# Patient Record
Sex: Male | Born: 1975 | Race: White | Hispanic: No | Marital: Married | Smoking: Current every day smoker
Health system: Southern US, Community
[De-identification: ages and names within clinical notes are randomized; demographics above are authoritative.]

## PROBLEM LIST (undated history)

## (undated) DIAGNOSIS — Z720 Tobacco use: Secondary | ICD-10-CM

## (undated) DIAGNOSIS — Z789 Other specified health status: Secondary | ICD-10-CM

## (undated) DIAGNOSIS — F1090 Alcohol use, unspecified, uncomplicated: Secondary | ICD-10-CM

## (undated) DIAGNOSIS — I10 Essential (primary) hypertension: Secondary | ICD-10-CM

## (undated) HISTORY — PX: LEG SURGERY: SHX1003

---

## 2009-03-20 ENCOUNTER — Ambulatory Visit (HOSPITAL_COMMUNITY): Admission: RE | Admit: 2009-03-20 | Discharge: 2009-03-21 | Payer: Self-pay | Admitting: Orthopedic Surgery

## 2009-09-21 ENCOUNTER — Inpatient Hospital Stay (HOSPITAL_COMMUNITY): Admission: RE | Admit: 2009-09-21 | Discharge: 2009-09-22 | Payer: Self-pay | Admitting: Orthopedic Surgery

## 2010-09-05 LAB — COMPREHENSIVE METABOLIC PANEL
AST: 22 U/L (ref 0–37)
Albumin: 4.5 g/dL (ref 3.5–5.2)
BUN: 8 mg/dL (ref 6–23)
CO2: 31 mEq/L (ref 19–32)
Calcium: 9.9 mg/dL (ref 8.4–10.5)
Chloride: 103 mEq/L (ref 96–112)
GFR calc non Af Amer: >60 mL/min (ref >60)
Glucose, Bld: 93 mg/dL (ref 70–99)
Potassium: 4.4 mEq/L (ref 3.5–5.1)
Total Bilirubin: 0.9 mg/dL (ref 0.3–1.2)
Total Protein: 7.8 g/dL (ref 6.0–8.3)

## 2010-09-05 LAB — PROTIME-INR: INR: 1.03 (ref 0.00–1.49)

## 2010-09-05 LAB — BASIC METABOLIC PANEL
CO2: 29 mEq/L (ref 19–32)
Chloride: 102 mEq/L (ref 96–112)
GFR calc Af Amer: >60 mL/min (ref >60)
GFR calc non Af Amer: >60 mL/min (ref >60)
Glucose, Bld: 123 mg/dL — ABNORMAL HIGH (ref 70–99)

## 2010-09-05 LAB — CBC
HCT: 47.2 % (ref 39.0–52.0)
MCHC: 34.5 g/dL (ref 30.0–36.0)
MCV: 88.9 fL (ref 78.0–100.0)
RDW: 13 % (ref 11.5–15.5)
RDW: 13.4 % (ref 11.5–15.5)
WBC: 5.7 10*3/uL (ref 4.0–10.5)

## 2010-09-05 LAB — DIFFERENTIAL
Basophils Absolute: 0 10*3/uL (ref 0.0–0.1)
Basophils Relative: 1 % (ref 0–1)
Eosinophils Relative: 2 % (ref 0–5)
Monocytes Absolute: 0.4 10*3/uL (ref 0.1–1.0)
Monocytes Relative: 8 % (ref 3–12)
Neutro Abs: 3.1 10*3/uL (ref 1.7–7.7)
Neutrophils Relative %: 55 % (ref 43–77)

## 2010-09-05 LAB — URINALYSIS, ROUTINE W REFLEX MICROSCOPIC
Glucose, UA: NEGATIVE mg/dL
Ketones, ur: NEGATIVE mg/dL
Nitrite: NEGATIVE
Specific Gravity, Urine: 1.006 (ref 1.005–1.030)
pH: 6.5 (ref 5.0–8.0)

## 2010-09-05 LAB — TYPE AND SCREEN: ABO/RH(D): O POS

## 2010-09-05 LAB — ANAEROBIC CULTURE

## 2010-09-05 LAB — URINE CULTURE

## 2010-09-05 LAB — TISSUE CULTURE: Culture: NO GROWTH

## 2010-09-05 LAB — GRAM STAIN

## 2010-09-20 LAB — CROSSMATCH
ABO/RH(D): O POS
Antibody Screen: NEGATIVE

## 2010-09-20 LAB — ABO/RH: ABO/RH(D): O POS

## 2010-09-20 LAB — CBC
Platelets: 132 10*3/uL — ABNORMAL LOW (ref 150–400)
RBC: 4.46 MIL/uL (ref 4.22–5.81)
WBC: 7.1 10*3/uL (ref 4.0–10.5)

## 2010-09-20 LAB — BASIC METABOLIC PANEL
Calcium: 8.3 mg/dL — ABNORMAL LOW (ref 8.4–10.5)
Creatinine, Ser: 1.06 mg/dL (ref 0.4–1.5)
GFR calc Af Amer: >60 mL/min (ref >60)

## 2010-09-21 LAB — URINALYSIS, ROUTINE W REFLEX MICROSCOPIC
Bilirubin Urine: NEGATIVE
Glucose, UA: NEGATIVE mg/dL
Hgb urine dipstick: NEGATIVE
Ketones, ur: NEGATIVE mg/dL
Protein, ur: NEGATIVE mg/dL
pH: 5 (ref 5.0–8.0)

## 2010-09-21 LAB — CBC
HCT: 47.7 % (ref 39.0–52.0)
MCV: 87.9 fL (ref 78.0–100.0)
RBC: 5.43 MIL/uL (ref 4.22–5.81)
WBC: 6.4 10*3/uL (ref 4.0–10.5)

## 2010-09-21 LAB — COMPREHENSIVE METABOLIC PANEL
AST: 23 U/L (ref 0–37)
Alkaline Phosphatase: 146 U/L — ABNORMAL HIGH (ref 39–117)
BUN: 13 mg/dL (ref 6–23)
CO2: 28 mEq/L (ref 19–32)
Chloride: 106 mEq/L (ref 96–112)
Creatinine, Ser: 1.09 mg/dL (ref 0.4–1.5)
GFR calc Af Amer: >60 mL/min (ref >60)
GFR calc non Af Amer: >60 mL/min (ref >60)
Potassium: 4.1 mEq/L (ref 3.5–5.1)
Total Bilirubin: 1 mg/dL (ref 0.3–1.2)

## 2010-09-21 LAB — URINE CULTURE
Colony Count: NO GROWTH
Culture: NO GROWTH

## 2010-09-21 LAB — DIFFERENTIAL
Basophils Absolute: 0 10*3/uL (ref 0.0–0.1)
Basophils Relative: 1 % (ref 0–1)
Eosinophils Relative: 5 % (ref 0–5)
Lymphocytes Relative: 36 % (ref 12–46)
Monocytes Absolute: 0.5 10*3/uL (ref 0.1–1.0)

## 2010-09-21 LAB — PROTIME-INR: Prothrombin Time: 12.8 seconds (ref 11.6–15.2)

## 2015-09-22 ENCOUNTER — Emergency Department: Payer: BLUE CROSS/BLUE SHIELD

## 2015-09-22 ENCOUNTER — Inpatient Hospital Stay
Admission: EM | Admit: 2015-09-22 | Discharge: 2015-09-23 | DRG: 534 | Disposition: A | Discharge status: Other Acute Inpatient Hospital | Payer: BLUE CROSS/BLUE SHIELD | Attending: Orthopedic Surgery | Admitting: Orthopedic Surgery

## 2015-09-22 DIAGNOSIS — F17210 Nicotine dependence, cigarettes, uncomplicated: Secondary | ICD-10-CM | POA: Diagnosis present

## 2015-09-22 DIAGNOSIS — I10 Essential (primary) hypertension: Secondary | ICD-10-CM | POA: Diagnosis present

## 2015-09-22 DIAGNOSIS — Z79899 Other long term (current) drug therapy: Secondary | ICD-10-CM | POA: Diagnosis not present

## 2015-09-22 DIAGNOSIS — W010XXA Fall on same level from slipping, tripping and stumbling without subsequent striking against object, initial encounter: Secondary | ICD-10-CM | POA: Diagnosis present

## 2015-09-22 DIAGNOSIS — S72401A Unspecified fracture of lower end of right femur, initial encounter for closed fracture: Secondary | ICD-10-CM | POA: Diagnosis present

## 2015-09-22 DIAGNOSIS — S7291XA Unspecified fracture of right femur, initial encounter for closed fracture: Secondary | ICD-10-CM | POA: Diagnosis present

## 2015-09-22 DIAGNOSIS — Z8249 Family history of ischemic heart disease and other diseases of the circulatory system: Secondary | ICD-10-CM

## 2015-09-22 DIAGNOSIS — Q899 Congenital malformation, unspecified: Secondary | ICD-10-CM

## 2015-09-22 DIAGNOSIS — S72451A Displaced supracondylar fracture without intracondylar extension of lower end of right femur, initial encounter for closed fracture: Principal | ICD-10-CM | POA: Diagnosis present

## 2015-09-22 DIAGNOSIS — Y92008 Other place in unspecified non-institutional (private) residence as the place of occurrence of the external cause: Secondary | ICD-10-CM | POA: Diagnosis not present

## 2015-09-22 HISTORY — DX: Essential (primary) hypertension: I10

## 2015-09-22 LAB — COMPREHENSIVE METABOLIC PANEL
ALK PHOS: 81 U/L (ref 38–126)
ALT: 27 U/L (ref 17–63)
AST: 26 U/L (ref 15–41)
Albumin: 4 g/dL (ref 3.5–5.0)
Anion gap: 7 (ref 5–15)
BILIRUBIN TOTAL: 0.7 mg/dL (ref 0.3–1.2)
BUN: 11 mg/dL (ref 6–20)
CALCIUM: 9.2 mg/dL (ref 8.9–10.3)
CO2: 24 mmol/L (ref 22–32)
CREATININE: 0.8 mg/dL (ref 0.61–1.24)
Chloride: 101 mmol/L (ref 101–111)
GFR calc non Af Amer: >60 mL/min (ref >60)
GLUCOSE: 122 mg/dL — AB (ref 65–99)
Potassium: 3.3 mmol/L — ABNORMAL LOW (ref 3.5–5.1)
SODIUM: 132 mmol/L — AB (ref 135–145)
TOTAL PROTEIN: 6.8 g/dL (ref 6.5–8.1)

## 2015-09-22 LAB — PROTIME-INR
INR: 0.96
PROTHROMBIN TIME: 13 s (ref 11.4–15.0)

## 2015-09-22 LAB — CBC
HEMATOCRIT: 41.7 % (ref 40.0–52.0)
HEMOGLOBIN: 14.5 g/dL (ref 13.0–18.0)
MCH: 30.5 pg (ref 26.0–34.0)
MCHC: 34.7 g/dL (ref 32.0–36.0)
MCV: 87.8 fL (ref 80.0–100.0)
Platelets: 168 10*3/uL (ref 150–440)
RBC: 4.75 MIL/uL (ref 4.40–5.90)
RDW: 13 % (ref 11.5–14.5)
WBC: 11.4 10*3/uL — ABNORMAL HIGH (ref 3.8–10.6)

## 2015-09-22 LAB — APTT: APTT: 29 s (ref 24–36)

## 2015-09-22 MED ORDER — HYDROMORPHONE HCL 1 MG/ML IJ SOLN
1.0000 mg | Freq: Once | INTRAMUSCULAR | Status: AC
  Administered 2015-09-22: 1 mg via INTRAVENOUS
  Filled 2015-09-22: qty 1

## 2015-09-22 MED ORDER — SODIUM CHLORIDE 0.9 % IV SOLN
INTRAVENOUS | Status: DC
  Administered 2015-09-23: via INTRAVENOUS

## 2015-09-22 MED ORDER — HYDROMORPHONE HCL 1 MG/ML IJ SOLN
1.0000 mg | INTRAMUSCULAR | Status: DC | PRN
  Administered 2015-09-23: 1 mg via INTRAVENOUS
  Filled 2015-09-22: qty 1

## 2015-09-22 MED ORDER — ONDANSETRON HCL 4 MG/2ML IJ SOLN
4.0000 mg | Freq: Once | INTRAMUSCULAR | Status: AC
  Administered 2015-09-22: 4 mg via INTRAVENOUS
  Filled 2015-09-22: qty 2

## 2015-09-22 MED ORDER — SODIUM CHLORIDE 0.9 % IV BOLUS (SEPSIS)
1000.0000 mL | Freq: Once | INTRAVENOUS | Status: AC
  Administered 2015-09-22: 1000 mL via INTRAVENOUS

## 2015-09-22 MED ORDER — OXYCODONE HCL 5 MG PO TABS
10.0000 mg | ORAL_TABLET | ORAL | Status: DC | PRN
  Administered 2015-09-22: 10 mg via ORAL
  Filled 2015-09-22: qty 2

## 2015-09-22 NOTE — ED Notes (Signed)
Patient transported to CT 

## 2015-09-22 NOTE — ED Notes (Signed)
Pt bib EMS w/ c/o leg injury.  Pt sts that he tried to jump dog gate, fell and has injury to R thigh.  Pt sts that he heard "pop".  Pt does have rod to R lower leg.  EMS gave pt 100 fentynl.  Pt denies pain at time, able to speak w/o issue and denies CP, SOB, N/V/D.
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
## 2015-09-23 ENCOUNTER — Inpatient Hospital Stay (HOSPITAL_COMMUNITY): Payer: BLUE CROSS/BLUE SHIELD

## 2015-09-23 ENCOUNTER — Encounter (HOSPITAL_COMMUNITY): Payer: Self-pay | Admitting: *Deleted

## 2015-09-23 ENCOUNTER — Inpatient Hospital Stay (HOSPITAL_COMMUNITY)
Admission: AD | Admit: 2015-09-23 | Discharge: 2015-09-25 | DRG: 481 | Disposition: A | Discharge status: Home / Self Care | Payer: BLUE CROSS/BLUE SHIELD | Source: Other Acute Inpatient Hospital | Attending: Orthopedic Surgery | Admitting: Orthopedic Surgery

## 2015-09-23 DIAGNOSIS — W010XXA Fall on same level from slipping, tripping and stumbling without subsequent striking against object, initial encounter: Secondary | ICD-10-CM | POA: Diagnosis not present

## 2015-09-23 DIAGNOSIS — M1731 Unilateral post-traumatic osteoarthritis, right knee: Secondary | ICD-10-CM | POA: Diagnosis not present

## 2015-09-23 DIAGNOSIS — F17210 Nicotine dependence, cigarettes, uncomplicated: Secondary | ICD-10-CM | POA: Diagnosis present

## 2015-09-23 DIAGNOSIS — M25561 Pain in right knee: Secondary | ICD-10-CM | POA: Diagnosis present

## 2015-09-23 DIAGNOSIS — E559 Vitamin D deficiency, unspecified: Secondary | ICD-10-CM | POA: Diagnosis not present

## 2015-09-23 DIAGNOSIS — D62 Acute posthemorrhagic anemia: Secondary | ICD-10-CM | POA: Diagnosis not present

## 2015-09-23 DIAGNOSIS — Z79899 Other long term (current) drug therapy: Secondary | ICD-10-CM | POA: Diagnosis not present

## 2015-09-23 DIAGNOSIS — Z01818 Encounter for other preprocedural examination: Secondary | ICD-10-CM

## 2015-09-23 DIAGNOSIS — E8889 Other specified metabolic disorders: Secondary | ICD-10-CM | POA: Diagnosis present

## 2015-09-23 DIAGNOSIS — S72451A Displaced supracondylar fracture without intracondylar extension of lower end of right femur, initial encounter for closed fracture: Principal | ICD-10-CM | POA: Diagnosis present

## 2015-09-23 DIAGNOSIS — S7291XA Unspecified fracture of right femur, initial encounter for closed fracture: Secondary | ICD-10-CM

## 2015-09-23 DIAGNOSIS — Z8249 Family history of ischemic heart disease and other diseases of the circulatory system: Secondary | ICD-10-CM

## 2015-09-23 DIAGNOSIS — I10 Essential (primary) hypertension: Secondary | ICD-10-CM | POA: Diagnosis not present

## 2015-09-23 DIAGNOSIS — S72401A Unspecified fracture of lower end of right femur, initial encounter for closed fracture: Secondary | ICD-10-CM | POA: Diagnosis present

## 2015-09-23 LAB — ABO/RH: ABO/RH(D): O POS

## 2015-09-23 LAB — URINALYSIS COMPLETE WITH MICROSCOPIC (ARMC ONLY)
BACTERIA UA: NONE SEEN
Bilirubin Urine: NEGATIVE
Glucose, UA: NEGATIVE mg/dL
Hgb urine dipstick: NEGATIVE
Ketones, ur: NEGATIVE mg/dL
LEUKOCYTES UA: NEGATIVE
Nitrite: NEGATIVE
PH: 6 (ref 5.0–8.0)
PROTEIN: NEGATIVE mg/dL
Specific Gravity, Urine: 1.003 — ABNORMAL LOW (ref 1.005–1.030)
WBC, UA: NONE SEEN WBC/hpf (ref 0–5)

## 2015-09-23 LAB — TYPE AND SCREEN
ABO/RH(D): O POS
ABO/RH(D): O POS
ANTIBODY SCREEN: NEGATIVE
Antibody Screen: NEGATIVE

## 2015-09-23 LAB — SURGICAL PCR SCREEN
MRSA, PCR: NEGATIVE
Staphylococcus aureus: NEGATIVE

## 2015-09-23 MED ORDER — TEMAZEPAM 15 MG PO CAPS: 15.0000 mg | ORAL_CAPSULE | Freq: Every evening | ORAL | Status: DC | PRN

## 2015-09-23 MED ORDER — BISACODYL 10 MG RE SUPP: 10.0000 mg | Freq: Every day | RECTAL | Status: DC | PRN

## 2015-09-23 MED ORDER — SODIUM CHLORIDE 0.9 % IV SOLN
INTRAVENOUS | Status: DC
Start: 2015-09-23 — End: 2015-09-24
  Administered 2015-09-23: 100 mL/h via INTRAVENOUS
  Administered 2015-09-23: 11:00:00 via INTRAVENOUS

## 2015-09-23 MED ORDER — MAGNESIUM HYDROXIDE 400 MG/5ML PO SUSP: 30.0000 mL | Freq: Every day | ORAL | Status: DC | PRN

## 2015-09-23 MED ORDER — ACETAMINOPHEN 650 MG RE SUPP: 650.0000 mg | Freq: Four times a day (QID) | RECTAL | Status: DC | PRN

## 2015-09-23 MED ORDER — OXYCODONE HCL 5 MG PO TABS
5.0000 mg | ORAL_TABLET | ORAL | Status: DC | PRN
  Administered 2015-09-23: 10 mg via ORAL
  Filled 2015-09-23: qty 2

## 2015-09-23 MED ORDER — METHOCARBAMOL 500 MG PO TABS: 500.0000 mg | ORAL_TABLET | Freq: Four times a day (QID) | ORAL | Status: DC | PRN

## 2015-09-23 MED ORDER — HYDROMORPHONE HCL 1 MG/ML IJ SOLN
1.0000 mg | INTRAMUSCULAR | Status: DC | PRN
  Administered 2015-09-23 – 2015-09-25 (×9): 1 mg via INTRAVENOUS
  Filled 2015-09-23 (×9): qty 1

## 2015-09-23 MED ORDER — DOCUSATE SODIUM 100 MG PO CAPS
100.0000 mg | ORAL_CAPSULE | Freq: Two times a day (BID) | ORAL | Status: DC
  Administered 2015-09-23 (×2): 100 mg via ORAL
  Filled 2015-09-23 (×3): qty 1

## 2015-09-23 MED ORDER — METHOCARBAMOL 500 MG PO TABS
500.0000 mg | ORAL_TABLET | Freq: Four times a day (QID) | ORAL | Status: DC | PRN
  Administered 2015-09-23 – 2015-09-24 (×3): 1000 mg via ORAL
  Filled 2015-09-23 (×2): qty 2

## 2015-09-23 MED ORDER — PANTOPRAZOLE SODIUM 40 MG PO TBEC
40.0000 mg | DELAYED_RELEASE_TABLET | Freq: Every day | ORAL | Status: DC
  Administered 2015-09-23 – 2015-09-25 (×2): 40 mg via ORAL
  Filled 2015-09-23 (×2): qty 1

## 2015-09-23 MED ORDER — CHLORHEXIDINE GLUCONATE 4 % EX LIQD
60.0000 mL | Freq: Once | CUTANEOUS | Status: AC
  Administered 2015-09-24: 4 via TOPICAL
  Filled 2015-09-23: qty 60

## 2015-09-23 MED ORDER — CEFAZOLIN SODIUM-DEXTROSE 2-4 GM/100ML-% IV SOLN
2.0000 g | INTRAVENOUS | Status: AC
  Administered 2015-09-24: 2 g via INTRAVENOUS
  Filled 2015-09-23: qty 100

## 2015-09-23 MED ORDER — FLEET ENEMA 7-19 GM/118ML RE ENEM: 1.0000 | ENEMA | Freq: Once | RECTAL | Status: DC | PRN

## 2015-09-23 MED ORDER — POVIDONE-IODINE 10 % EX SWAB: 2.0000 "application " | Freq: Once | CUTANEOUS | Status: DC

## 2015-09-23 MED ORDER — OXYCODONE HCL 5 MG PO TABS
5.0000 mg | ORAL_TABLET | ORAL | Status: DC | PRN
  Administered 2015-09-23 – 2015-09-25 (×11): 15 mg via ORAL
  Filled 2015-09-23 (×11): qty 3

## 2015-09-23 MED ORDER — DOCUSATE SODIUM 100 MG PO CAPS: 100.0000 mg | ORAL_CAPSULE | Freq: Two times a day (BID) | ORAL | Status: DC

## 2015-09-23 MED ORDER — DIPHENHYDRAMINE HCL 12.5 MG/5ML PO ELIX: 12.5000 mg | ORAL_SOLUTION | ORAL | Status: DC | PRN

## 2015-09-23 MED ORDER — ONDANSETRON HCL 4 MG/2ML IJ SOLN: 4.0000 mg | Freq: Four times a day (QID) | INTRAMUSCULAR | Status: DC | PRN

## 2015-09-23 MED ORDER — ACETAMINOPHEN 500 MG PO TABS
1000.0000 mg | ORAL_TABLET | Freq: Four times a day (QID) | ORAL | Status: DC
  Administered 2015-09-23 – 2015-09-25 (×7): 1000 mg via ORAL
  Filled 2015-09-23 (×8): qty 2

## 2015-09-23 MED ORDER — DEXTROSE 5 % IV SOLN: 500.0000 mg | Freq: Four times a day (QID) | INTRAVENOUS | Status: DC | PRN

## 2015-09-23 MED ORDER — ONDANSETRON HCL 4 MG PO TABS: 4.0000 mg | ORAL_TABLET | Freq: Four times a day (QID) | ORAL | Status: DC | PRN

## 2015-09-23 MED ORDER — ACETAMINOPHEN 325 MG PO TABS: 650.0000 mg | ORAL_TABLET | Freq: Four times a day (QID) | ORAL | Status: DC | PRN

## 2015-09-23 MED ORDER — POTASSIUM CHLORIDE 20 MEQ PO PACK
40.0000 meq | PACK | Freq: Once | ORAL | Status: AC
  Administered 2015-09-23: 40 meq via ORAL
  Filled 2015-09-23: qty 2

## 2015-09-23 MED ORDER — METHOCARBAMOL 1000 MG/10ML IJ SOLN: 500.0000 mg | Freq: Four times a day (QID) | INTRAVENOUS | Status: DC | PRN

## 2015-09-23 NOTE — Progress Notes (Signed)
Orthopaedic Trauma Service Progress Note  Subjective  Pt seen and evaluated See dictation for full report:[id number]   Review of Systems  Constitutional: Negative for fever and chills.  Eyes: Negative for blurred vision and double vision.  Respiratory: Negative for shortness of breath and wheezing.   Cardiovascular: Negative for chest pain and palpitations.  Gastrointestinal: Negative for nausea, vomiting and abdominal pain.  Genitourinary: Negative for dysuria.  Musculoskeletal:       R knee pain   Neurological: Negative for headaches.       No new numbness or tingling Baseline right foot plantar numbness     Objective   BP 115/71 mmHg  Pulse 76  Temp(Src) 98.5 F (36.9 C) (Oral)  Resp 16  Ht _0  (1.956 m)  Wt 99.79 kg (220 lb)  BMI 26.08 kg/m2  SpO2 97%  Intake/Output      04/07 0701 - 04/08 0700 04/08 0701 - 04/09 0700   P.O.  0   Total Intake(mL/kg)  0 (0)   Urine (mL/kg/hr) 700 300 (0.5)   Total Output 700 300   Net -700 -300          Labs Results for ISRAEL, WERTS (MRN [id number]) as of 09/23/2015 12:27  Ref. Range 09/22/2015 22:39  Sodium Latest Ref Range: 135-145 mmol/L 132 (L)  Potassium Latest Ref Range: 3.5-5.1 mmol/L 3.3 (L)  Chloride Latest Ref Range: 101-111 mmol/L 101  CO2 Latest Ref Range: 22-32 mmol/L 24  BUN Latest Ref Range: 6-20 mg/dL 11  Creatinine Latest Ref Range: 0.61-1.24 mg/dL 0.80  Calcium Latest Ref Range: 8.9-10.3 mg/dL 9.2  EGFR (Non-African Amer.) Latest Ref Range: >60 mL/min >60  EGFR (African American) Latest Ref Range: >60 mL/min >60  Glucose Latest Ref Range: 65-99 mg/dL 122 (H)  Anion gap Latest Ref Range: 5-15  7  Alkaline Phosphatase Latest Ref Range: 38-126 U/L 81  Albumin Latest Ref Range: 3.5-5.0 g/dL 4.0  AST Latest Ref Range: 15-41 U/L 26  ALT Latest Ref Range: 17-63 U/L 27  Total Protein Latest Ref Range: 6.5-8.1 g/dL 6.8  Total Bilirubin Latest Ref Range: 0.3-1.2 mg/dL 0.7  WBC Latest Ref Range: 3.8-10.6 K/uL  11.4 (H)  RBC Latest Ref Range: 4.40-5.90 MIL/uL 4.75  Hemoglobin Latest Ref Range: 13.0-18.0 g/dL 14.5  HCT Latest Ref Range: 40.0-52.0 % 41.7  MCV Latest Ref Range: 80.0-100.0 fL 87.8  MCH Latest Ref Range: 26.0-34.0 pg 30.5  MCHC Latest Ref Range: 32.0-36.0 g/dL 34.7  RDW Latest Ref Range: 11.5-14.5 % 13.0  Platelets Latest Ref Range: 150-440 K/uL 168  Prothrombin Time Latest Ref Range: 11.4-15.0 seconds 13.0  INR Unknown 0.96  APTT Latest Ref Range: 24-36 seconds 29     Exam  Gen: awake and alert, NAD, in bed Lungs: clear fields B Cardiac: RRR, s1 and s2 Abd: + BS, NTND Ext:       Right Lower Extremity   Knee immobilizer in place  No compressive dressing  Old surgical wounds well healed  + deformity distal femur  + knee effusion  Hip and ankle unremarkable  + DP pulse  Swelling minimal  Ext warm  No EHL function, no toe flexion (this is baseline)  Ankle function preserved   Knee stability not assessed due to acute fracture   No open wounds   Imaging  CT R distal femur- comminuted R distal femur fx with extensive metaphyseal involvement. Fracture planes extend into the joint. Severe DJD R knee noted as well  Assessment and Plan  POD/HD#: 1   - comminuted R distal femur fracture with pre-existing endstage DJD R knee:  OR tomorrow for IMN R distal femur  Bedrest for now  Ice   Knee immobilizer   - Pain management:  Continue with current regimen   - ABL anemia/Hemodynamics  Stable   - Medical issues   Nicotine dependence   Discussed nicotine use. 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   - DVT/PE prophylaxis:  Foot pumps for now  Lovenox post op  - ID:   periop abx  - Metabolic Bone Disease:  Low energy fracture mechanism, will check vitamin D levels and evaluate from there  - Activity:  Bed rest  - FEN/GI prophylaxis/Foley/Lines:  Reg diet  protonix  IVF  NPO after MN   - Dispo:  OR tomorrow for retrograde  IMN R femur     Jari Pigg, PA-C [facility] [phone] (O) 09/23/2015 12:30 PM

## 2015-09-23 NOTE — Progress Notes (Signed)
I reviewed the patient's right knee CT scan with 3D reconstructions.  The fracture is extremely comminuted and extends very distal to the femoral condyles posteriorly.  I feel this fracture is complex enough to require management by an orthopaedic trauma specialist.  I have personally spoken with Dr. Myrene GalasMichael Handy at [facility] who has agreed to take care of this patient at [facility].  The patient will be transferred to [facility] tonight for surgical fixation tomorrow.  I have ordered a knee immobilizer to be applied to the right knee for transfer.   I have also ordered potassium chloride to be given for his potassium level of 3.3.  I have told the ER attending to send a copy of the CT scan with the patient for review by Dr. Carola FrostHandy.  I have explained the plan to the patient.  He knows Dr. Carola FrostHandy from his prior tibial operation.  He and his wife are in agreement with this plan.

## 2015-09-23 NOTE — ED Provider Notes (Signed)
-----------------------------------------   12:56 AM on 09/23/2015 -----------------------------------------   Blood pressure 126/75, pulse 89, temperature 98.2 F (36.8 C), temperature source Oral, resp. rate 16, height 6\' 5"  (1.956 m), weight 220 lb (99.791 kg), SpO2 99 %.  Assuming care from Dr. Lenard LancePaduchowski.  In short, Christian Bates is a 40 y.o. male with a chief complaint of Leg Injury .  Refer to the original H&P for additional details.  The current plan of care is to await instruction from Dr. Shela NevinKrasinksi.  The patient has previously had surgery done by Dr. Carola FrostHandy at [facility] and Dr. Martha ClanKrasinski reports that he was attempting to transfer the patient so he can have continued care by his previous orthopedic surgeon. Dr. Martha ClanKrasinski did speak to Dr. Carola FrostHandy who did accept the patient in transfer to [facility]. The patient will be transferred for orthopedic surgery.   Rebecka ApleyAllison P Vardaan Depascale, MD 09/23/15 [phone]

## 2015-09-24 ENCOUNTER — Inpatient Hospital Stay (HOSPITAL_COMMUNITY): Payer: BLUE CROSS/BLUE SHIELD | Admitting: Anesthesiology

## 2015-09-24 ENCOUNTER — Inpatient Hospital Stay (HOSPITAL_COMMUNITY): Payer: BLUE CROSS/BLUE SHIELD

## 2015-09-24 ENCOUNTER — Encounter (HOSPITAL_COMMUNITY)
Admission: AD | Disposition: A | Discharge status: Home / Self Care | Payer: Self-pay | Source: Other Acute Inpatient Hospital | Attending: Orthopedic Surgery

## 2015-09-24 HISTORY — PX: FEMUR IM NAIL: SHX1597

## 2015-09-24 LAB — APTT: aPTT: 31 seconds (ref 24–37)

## 2015-09-24 LAB — CBC
HEMATOCRIT: 35.5 % — AB (ref 39.0–52.0)
HEMATOCRIT: 33.3 % — AB (ref 39.0–52.0)
HEMOGLOBIN: 12.1 g/dL — AB (ref 13.0–17.0)
HEMOGLOBIN: 11 g/dL — AB (ref 13.0–17.0)
MCH: 31.3 pg (ref 26.0–34.0)
MCH: 30 pg (ref 26.0–34.0)
MCHC: 34.1 g/dL (ref 30.0–36.0)
MCHC: 33 g/dL (ref 30.0–36.0)
MCV: 92 fL (ref 78.0–100.0)
MCV: 90.7 fL (ref 78.0–100.0)
Platelets: 136 10*3/uL — ABNORMAL LOW (ref 150–400)
Platelets: 133 10*3/uL — ABNORMAL LOW (ref 150–400)
RBC: 3.86 MIL/uL — ABNORMAL LOW (ref 4.22–5.81)
RBC: 3.67 MIL/uL — ABNORMAL LOW (ref 4.22–5.81)
RDW: 12.9 % (ref 11.5–15.5)
RDW: 12.5 % (ref 11.5–15.5)
WBC: 5.5 10*3/uL (ref 4.0–10.5)
WBC: 6.7 10*3/uL (ref 4.0–10.5)

## 2015-09-24 LAB — COMPREHENSIVE METABOLIC PANEL
ALK PHOS: 84 U/L (ref 38–126)
ALT: 18 U/L (ref 17–63)
ANION GAP: 8 (ref 5–15)
AST: 19 U/L (ref 15–41)
Albumin: 3.1 g/dL — ABNORMAL LOW (ref 3.5–5.0)
BILIRUBIN TOTAL: 1.1 mg/dL (ref 0.3–1.2)
BUN: 6 mg/dL (ref 6–20)
CALCIUM: 8.6 mg/dL — AB (ref 8.9–10.3)
CO2: 25 mmol/L (ref 22–32)
CREATININE: 0.88 mg/dL (ref 0.61–1.24)
Chloride: 104 mmol/L (ref 101–111)
GFR calc Af Amer: >60 mL/min (ref >60)
GFR calc non Af Amer: >60 mL/min (ref >60)
GLUCOSE: 103 mg/dL — AB (ref 65–99)
Potassium: 4 mmol/L (ref 3.5–5.1)
Sodium: 137 mmol/L (ref 135–145)
TOTAL PROTEIN: 5.6 g/dL — AB (ref 6.5–8.1)

## 2015-09-24 LAB — PROTIME-INR
INR: 1.05 (ref 0.00–1.49)
Prothrombin Time: 13.9 seconds (ref 11.6–15.2)

## 2015-09-24 LAB — CREATININE, SERUM
CREATININE: 0.95 mg/dL (ref 0.61–1.24)
GFR calc Af Amer: >60 mL/min (ref >60)
GFR calc non Af Amer: >60 mL/min (ref >60)

## 2015-09-24 SURGERY — INSERTION, INTRAMEDULLARY ROD, FEMUR, RETROGRADE
Anesthesia: General | Site: Leg Lower | Laterality: Right

## 2015-09-24 MED ORDER — OXYCODONE HCL 5 MG PO TABS: 5.0000 mg | ORAL_TABLET | Freq: Once | ORAL | Status: DC | PRN

## 2015-09-24 MED ORDER — ONDANSETRON HCL 4 MG/2ML IJ SOLN
INTRAMUSCULAR | Status: AC
  Filled 2015-09-24: qty 2

## 2015-09-24 MED ORDER — ROCURONIUM BROMIDE 50 MG/5ML IV SOLN
INTRAVENOUS | Status: AC
  Filled 2015-09-24: qty 1

## 2015-09-24 MED ORDER — POLYETHYLENE GLYCOL 3350 17 G PO PACK
17.0000 g | PACK | Freq: Every day | ORAL | Status: DC
  Administered 2015-09-25: 17 g via ORAL
  Filled 2015-09-24: qty 1

## 2015-09-24 MED ORDER — DEXAMETHASONE SODIUM PHOSPHATE 4 MG/ML IJ SOLN
INTRAMUSCULAR | Status: AC
Start: 2015-09-24 — End: 2015-09-24
  Filled 2015-09-24: qty 1

## 2015-09-24 MED ORDER — FENTANYL CITRATE (PF) 250 MCG/5ML IJ SOLN
INTRAMUSCULAR | Status: AC
  Filled 2015-09-24: qty 5

## 2015-09-24 MED ORDER — ACETAMINOPHEN 325 MG PO TABS
650.0000 mg | ORAL_TABLET | Freq: Four times a day (QID) | ORAL | Status: DC | PRN
Start: 2015-09-24 — End: 2015-09-25

## 2015-09-24 MED ORDER — ONDANSETRON HCL 4 MG/2ML IJ SOLN
INTRAMUSCULAR | Status: DC | PRN
  Administered 2015-09-24: 4 mg via INTRAVENOUS

## 2015-09-24 MED ORDER — PROPOFOL 10 MG/ML IV BOLUS
INTRAVENOUS | Status: AC
  Filled 2015-09-24: qty 20

## 2015-09-24 MED ORDER — 0.9 % SODIUM CHLORIDE (POUR BTL) OPTIME
TOPICAL | Status: DC | PRN
  Administered 2015-09-24: 1000 mL

## 2015-09-24 MED ORDER — DOCUSATE SODIUM 100 MG PO CAPS
100.0000 mg | ORAL_CAPSULE | Freq: Two times a day (BID) | ORAL | Status: DC
  Administered 2015-09-24 – 2015-09-25 (×2): 100 mg via ORAL
  Filled 2015-09-24 (×2): qty 1

## 2015-09-24 MED ORDER — LIDOCAINE HCL (CARDIAC) 20 MG/ML IV SOLN
INTRAVENOUS | Status: DC | PRN
  Administered 2015-09-24: 70 mg via INTRATRACHEAL

## 2015-09-24 MED ORDER — POTASSIUM CHLORIDE IN NACL 20-0.9 MEQ/L-% IV SOLN
INTRAVENOUS | Status: DC
  Administered 2015-09-24: 15:00:00 via INTRAVENOUS
  Filled 2015-09-24: qty 1000

## 2015-09-24 MED ORDER — MIDAZOLAM HCL 2 MG/2ML IJ SOLN
INTRAMUSCULAR | Status: AC
  Filled 2015-09-24: qty 2

## 2015-09-24 MED ORDER — SUGAMMADEX SODIUM 200 MG/2ML IV SOLN
INTRAVENOUS | Status: DC | PRN
  Administered 2015-09-24: 200 mg via INTRAVENOUS

## 2015-09-24 MED ORDER — ROCURONIUM BROMIDE 100 MG/10ML IV SOLN
INTRAVENOUS | Status: DC | PRN
  Administered 2015-09-24: 20 mg via INTRAVENOUS
  Administered 2015-09-24: 10 mg via INTRAVENOUS
  Administered 2015-09-24 (×2): 50 mg via INTRAVENOUS
  Administered 2015-09-24: 10 mg via INTRAVENOUS

## 2015-09-24 MED ORDER — LISINOPRIL-HYDROCHLOROTHIAZIDE 10-12.5 MG PO TABS: 1.0000 | ORAL_TABLET | Freq: Every day | ORAL | Status: DC

## 2015-09-24 MED ORDER — ONDANSETRON HCL 4 MG PO TABS: 4.0000 mg | ORAL_TABLET | Freq: Four times a day (QID) | ORAL | Status: DC | PRN

## 2015-09-24 MED ORDER — SUGAMMADEX SODIUM 200 MG/2ML IV SOLN
INTRAVENOUS | Status: AC
  Filled 2015-09-24: qty 2

## 2015-09-24 MED ORDER — HYDROCHLOROTHIAZIDE 12.5 MG PO CAPS
12.5000 mg | ORAL_CAPSULE | Freq: Every day | ORAL | Status: DC
  Administered 2015-09-24 – 2015-09-25 (×2): 12.5 mg via ORAL
  Filled 2015-09-24 (×2): qty 1

## 2015-09-24 MED ORDER — ENOXAPARIN SODIUM 40 MG/0.4ML ~~LOC~~ SOLN
40.0000 mg | SUBCUTANEOUS | Status: DC
  Administered 2015-09-25: 40 mg via SUBCUTANEOUS
  Filled 2015-09-24: qty 0.4

## 2015-09-24 MED ORDER — HYDROMORPHONE HCL 1 MG/ML IJ SOLN
0.2500 mg | INTRAMUSCULAR | Status: DC | PRN
  Administered 2015-09-24 (×4): 0.5 mg via INTRAVENOUS

## 2015-09-24 MED ORDER — LIDOCAINE HCL (CARDIAC) 20 MG/ML IV SOLN
INTRAVENOUS | Status: AC
  Filled 2015-09-24: qty 5

## 2015-09-24 MED ORDER — DEXAMETHASONE SODIUM PHOSPHATE 4 MG/ML IJ SOLN
INTRAMUSCULAR | Status: DC | PRN
  Administered 2015-09-24: 4 mg via INTRAVENOUS

## 2015-09-24 MED ORDER — LACTATED RINGERS IV SOLN
INTRAVENOUS | Status: DC
  Administered 2015-09-24 (×2): via INTRAVENOUS

## 2015-09-24 MED ORDER — FENTANYL CITRATE (PF) 250 MCG/5ML IJ SOLN
INTRAMUSCULAR | Status: DC | PRN
  Administered 2015-09-24 (×2): 50 ug via INTRAVENOUS
  Administered 2015-09-24: 150 ug via INTRAVENOUS
  Administered 2015-09-24: 100 ug via INTRAVENOUS
  Administered 2015-09-24 (×3): 50 ug via INTRAVENOUS

## 2015-09-24 MED ORDER — METOCLOPRAMIDE HCL 5 MG/ML IJ SOLN: 5.0000 mg | Freq: Three times a day (TID) | INTRAMUSCULAR | Status: DC | PRN

## 2015-09-24 MED ORDER — METOCLOPRAMIDE HCL 5 MG PO TABS: 5.0000 mg | ORAL_TABLET | Freq: Three times a day (TID) | ORAL | Status: DC | PRN

## 2015-09-24 MED ORDER — HYDROMORPHONE HCL 1 MG/ML IJ SOLN
INTRAMUSCULAR | Status: AC
  Administered 2015-09-24: 0.5 mg via INTRAVENOUS
  Filled 2015-09-24: qty 2

## 2015-09-24 MED ORDER — MIDAZOLAM HCL 2 MG/2ML IJ SOLN
INTRAMUSCULAR | Status: DC | PRN
  Administered 2015-09-24: 2 mg via INTRAVENOUS

## 2015-09-24 MED ORDER — ACETAMINOPHEN 650 MG RE SUPP: 650.0000 mg | Freq: Four times a day (QID) | RECTAL | Status: DC | PRN

## 2015-09-24 MED ORDER — LISINOPRIL 10 MG PO TABS
10.0000 mg | ORAL_TABLET | Freq: Every day | ORAL | Status: DC
  Administered 2015-09-24 – 2015-09-25 (×2): 10 mg via ORAL
  Filled 2015-09-24 (×2): qty 1

## 2015-09-24 MED ORDER — ONDANSETRON HCL 4 MG/2ML IJ SOLN: 4.0000 mg | Freq: Four times a day (QID) | INTRAMUSCULAR | Status: DC | PRN

## 2015-09-24 MED ORDER — OXYCODONE HCL 5 MG/5ML PO SOLN: 5.0000 mg | Freq: Once | ORAL | Status: DC | PRN

## 2015-09-24 MED ORDER — PROPOFOL 10 MG/ML IV BOLUS
INTRAVENOUS | Status: DC | PRN
  Administered 2015-09-24: 20 mg via INTRAVENOUS
  Administered 2015-09-24: 180 mg via INTRAVENOUS

## 2015-09-24 MED ORDER — CEFAZOLIN SODIUM 1-5 GM-% IV SOLN
1.0000 g | Freq: Four times a day (QID) | INTRAVENOUS | Status: AC
  Administered 2015-09-24 – 2015-09-25 (×3): 1 g via INTRAVENOUS
  Filled 2015-09-24 (×3): qty 50

## 2015-09-24 SURGICAL SUPPLY — 70 items
BANDAGE ACE 6X5 VEL STRL LF (GAUZE/BANDAGES/DRESSINGS) ×6 IMPLANT
BANDAGE ELASTIC 4 VELCRO ST LF (GAUZE/BANDAGES/DRESSINGS) IMPLANT
BANDAGE ELASTIC 6 VELCRO ST LF (GAUZE/BANDAGES/DRESSINGS) IMPLANT
BANDAGE ESMARK 6X9 LF (GAUZE/BANDAGES/DRESSINGS) IMPLANT
BIT DRILL CALIBRATED 4.3MMX365 (DRILL) ×1 IMPLANT
BIT DRILL CROWE PNT TWST 4.5MM (DRILL) ×1 IMPLANT
BNDG COHESIVE 4X5 TAN STRL (GAUZE/BANDAGES/DRESSINGS) ×3 IMPLANT
BNDG ESMARK 6X9 LF (GAUZE/BANDAGES/DRESSINGS)
BNDG GAUZE ELAST 4 BULKY (GAUZE/BANDAGES/DRESSINGS) ×6 IMPLANT
BRUSH SCRUB DISP (MISCELLANEOUS) ×6 IMPLANT
COVER MAYO STAND STRL (DRAPES) IMPLANT
COVER SURGICAL LIGHT HANDLE (MISCELLANEOUS) ×3 IMPLANT
DRAPE C-ARM 42X72 X-RAY (DRAPES) ×3 IMPLANT
DRAPE C-ARMOR (DRAPES) ×3 IMPLANT
DRAPE IMP U-DRAPE 54X76 (DRAPES) ×3 IMPLANT
DRAPE INCISE IOBAN 66X45 STRL (DRAPES) IMPLANT
DRAPE ORTHO SPLIT 77X108 STRL (DRAPES) ×4
DRAPE SURG ORHT 6 SPLT 77X108 (DRAPES) ×2 IMPLANT
DRAPE U-SHAPE 47X51 STRL (DRAPES) ×3 IMPLANT
DRILL CALIBRATED 4.3MMX365 (DRILL) ×3
DRILL CROWE POINT TWIST 4.5MM (DRILL) ×3
DRSG MEPILEX BORDER 4X4 (GAUZE/BANDAGES/DRESSINGS) ×3 IMPLANT
DRSG MEPITEL 4X7.2 (GAUZE/BANDAGES/DRESSINGS) ×3 IMPLANT
DRSG PAD ABDOMINAL 8X10 ST (GAUZE/BANDAGES/DRESSINGS) ×3 IMPLANT
ELECT REM PT RETURN 9FT ADLT (ELECTROSURGICAL) ×3
ELECTRODE REM PT RTRN 9FT ADLT (ELECTROSURGICAL) ×1 IMPLANT
EVACUATOR 1/8 PVC DRAIN (DRAIN) IMPLANT
GAUZE SPONGE 4X4 12PLY STRL (GAUZE/BANDAGES/DRESSINGS) IMPLANT
GAUZE XEROFORM 1X8 LF (GAUZE/BANDAGES/DRESSINGS) IMPLANT
GLOVE BIO SURGEON STRL SZ7.5 (GLOVE) IMPLANT
GLOVE BIO SURGEON STRL SZ8 (GLOVE) ×3 IMPLANT
GLOVE BIOGEL PI IND STRL 7.5 (GLOVE) ×1 IMPLANT
GLOVE BIOGEL PI IND STRL 8 (GLOVE) ×1 IMPLANT
GLOVE BIOGEL PI INDICATOR 7.5 (GLOVE) ×2
GLOVE BIOGEL PI INDICATOR 8 (GLOVE) ×2
GOWN STRL REUS W/ TWL LRG LVL3 (GOWN DISPOSABLE) ×1 IMPLANT
GOWN STRL REUS W/ TWL XL LVL3 (GOWN DISPOSABLE) ×1 IMPLANT
GOWN STRL REUS W/TWL LRG LVL3 (GOWN DISPOSABLE) ×2
GOWN STRL REUS W/TWL XL LVL3 (GOWN DISPOSABLE) ×2
GUIDEPIN 3.2X17.5 THRD DISP (PIN) ×3 IMPLANT
GUIDEWIRE BEAD TIP (WIRE) ×6 IMPLANT
KIT BASIN OR (CUSTOM PROCEDURE TRAY) ×3 IMPLANT
KIT ROOM TURNOVER OR (KITS) ×3 IMPLANT
NAIL FEM RETRO 12X460 (Nail) ×3 IMPLANT
PACK ORTHO EXTREMITY (CUSTOM PROCEDURE TRAY) ×3 IMPLANT
PACK UNIVERSAL I (CUSTOM PROCEDURE TRAY) ×3 IMPLANT
PAD ARMBOARD 7.5X6 YLW CONV (MISCELLANEOUS) ×6 IMPLANT
PADDING CAST ABS 4INX4YD NS (CAST SUPPLIES)
PADDING CAST ABS COTTON 4X4 ST (CAST SUPPLIES) IMPLANT
SCREW CORT TI DBL LEAD 5X36 (Screw) ×3 IMPLANT
SCREW CORT TI DBL LEAD 5X38 (Screw) ×3 IMPLANT
SCREW CORT TI DBL LEAD 5X85 (Screw) ×9 IMPLANT
SCREW CORT TI DBL LEAD 5X95 (Screw) ×3 IMPLANT
SPONGE LAP 18X18 X RAY DECT (DISPOSABLE) IMPLANT
STAPLER VISISTAT 35W (STAPLE) ×3 IMPLANT
STOCKINETTE IMPERVIOUS LG (DRAPES) ×3 IMPLANT
SUT ETHILON 3 0 PS 1 (SUTURE) ×6 IMPLANT
SUT PROLENE 3 0 PS 2 (SUTURE) IMPLANT
SUT VIC AB 0 CT1 27 (SUTURE)
SUT VIC AB 0 CT1 27XBRD ANBCTR (SUTURE) IMPLANT
SUT VIC AB 1 CT1 27 (SUTURE) ×2
SUT VIC AB 1 CT1 27XBRD ANBCTR (SUTURE) ×1 IMPLANT
SUT VIC AB 2-0 CT1 27 (SUTURE) ×2
SUT VIC AB 2-0 CT1 TAPERPNT 27 (SUTURE) ×1 IMPLANT
SUT VIC AB 2-0 CT3 27 (SUTURE) IMPLANT
TOWEL OR 17X24 6PK STRL BLUE (TOWEL DISPOSABLE) ×3 IMPLANT
TOWEL OR 17X26 10 PK STRL BLUE (TOWEL DISPOSABLE) ×6 IMPLANT
TUBE CONNECTING 12'X1/4 (SUCTIONS) ×1
TUBE CONNECTING 12X1/4 (SUCTIONS) ×2 IMPLANT
YANKAUER SUCT BULB TIP NO VENT (SUCTIONS) ×3 IMPLANT

## 2015-09-24 NOTE — Anesthesia Postprocedure Evaluation (Signed)
Anesthesia Post Note  Patient: Christian Bates  Procedure(s) Performed: Procedure(s) (LRB): INTRAMEDULLARY (IM) RETROGRADE FEMORAL NAILING (Right)  Patient location during evaluation: PACU Anesthesia Type: General Level of consciousness: awake Pain management: pain level controlled Vital Signs Assessment: post-procedure vital signs reviewed and stable Respiratory status: spontaneous breathing and respiratory function stable Cardiovascular status: stable Postop Assessment: no signs of nausea or vomiting Anesthetic complications: no    Last Vitals:  Filed Vitals:   09/24/15 1400 09/24/15 1420  BP: 145/90 131/99  Pulse: 102 104  Temp:  37.4 C  Resp: 10 14    Last Pain:  Filed Vitals:   09/24/15 1421  PainSc: 4                  [name]

## 2015-09-24 NOTE — Progress Notes (Signed)
Utilization review completed.  

## 2015-09-24 NOTE — Anesthesia Procedure Notes (Signed)
Procedure Name: Intubation Date/Time: 09/24/2015 10:26 AM Performed by: Alanda AmassFRIEDMAN, Chrystian Ressler A Pre-anesthesia Checklist: Emergency Drugs available, Suction available, Timeout performed, Patient being monitored and Patient identified Patient Re-evaluated:Patient Re-evaluated prior to inductionOxygen Delivery Method: Circle system utilized Preoxygenation: Pre-oxygenation with 100% oxygen Intubation Type: IV induction Ventilation: Mask ventilation without difficulty Laryngoscope Size: Mac and 3 Grade View: Grade III Tube type: Oral Tube size: 7.5 mm Number of attempts: 1 Airway Equipment and Method: Stylet Placement Confirmation: ETT inserted through vocal cords under direct vision,  positive ETCO2 and breath sounds checked- equal and bilateral Secured at: 23 cm Tube secured with: Tape Dental Injury: Teeth and Oropharynx as per pre-operative assessment

## 2015-09-24 NOTE — Anesthesia Preprocedure Evaluation (Addendum)
Anesthesia Evaluation  Patient identified by MRN, date of birth, ID band Patient awake    Reviewed: Allergy & Precautions, NPO status , Patient's Chart, lab work & pertinent test results  History of Anesthesia Complications Negative for: history of anesthetic complications  Airway Mallampati: II  TM Distance: >3 FB Neck ROM: Full    Dental  (+) Teeth Intact, Dental Advisory Given   Pulmonary neg shortness of breath, neg sleep apnea, neg COPD, neg recent URI, Current Smoker,    breath sounds clear to auscultation       Cardiovascular hypertension, Pt. on medications (-) angina(-) Past MI  Rhythm:Regular     Neuro/Psych negative neurological ROS  negative psych ROS   GI/Hepatic negative GI ROS, Neg liver ROS,   Endo/Other  negative endocrine ROS  Renal/GU negative Renal ROS     Musculoskeletal   Abdominal   Peds  Hematology negative hematology ROS (+)   Anesthesia Other Findings Right femur fracture  Reproductive/Obstetrics                            Anesthesia Physical Anesthesia Plan  ASA: II  Anesthesia Plan: General   Post-op Pain Management:    Induction: Intravenous  Airway Management Planned: LMA and Oral ETT  Additional Equipment: None  Intra-op Plan:   Post-operative Plan: Extubation in OR  Informed Consent: I have reviewed the patients History and Physical, chart, labs and discussed the procedure including the risks, benefits and alternatives for the proposed anesthesia with the patient or authorized representative who has indicated his/her understanding and acceptance.   Dental advisory given  Plan Discussed with: CRNA and Surgeon  Anesthesia Plan Comments:         Anesthesia Quick Evaluation

## 2015-09-24 NOTE — Consult Note (Signed)
NAMNancie Neas:  Kubicek, Quentyn              ACCOUNT NO.:  [id number]  MEDICAL RECORD NO.:  [id number]  LOCATION:  5N12C                        FACILITY:  [facility]  PHYSICIAN:  Doralee AlbinoMichael H. Carola FrostHandy, M.D. DATE OF BIRTH:  [birth year]  DATE OF CONSULTATION: DATE OF DISCHARGE:                                CONSULTATION   DATE OF INJURY:  September 22, 2015.  CHIEF COMPLAINT:  Closed right distal femur fracture.  REQUESTING PHYSICIAN:  Kathreen DevoidKevin L Krasinski, MD, [facility], Department of Orthopedics.  BRIEF HISTORY OF PRESENT ILLNESS:  Mr. Laurine BlazerWalters is a very pleasant 40- year-old white male with a history to the OTS for treatment of a long- standing right tibia nonunion.  The patient recovered well from this, although, he does have baseline motor and sensory dysfunction in his right foot.  He was at home yesterday evening when he tripped over a dog gate landing directly on his right knee.  The patient had an immediate onset of severe pain and knew instantaneously that his right leg was broken.  He was brought to [facility] where he was found to have an isolated, closed, and comminuted right distal femur fracture. Given the severity of the injury, Dr. Martha ClanKrasinski felt the patient needed the expertise of an orthopedic traumatologist for definitive management and given the patient's history with us, he was [facility] management.  The patient was seen and evaluated this morning.  He is comfortable, he is in a knee immobilizer and leg is propped up on few pillows.  He denies any additional injuries elsewhere. Denies any new onset numbness or tingling.  He does have baseline numbness and tingling in the plantar aspect of his right foot from his previous right tibia fracture.  He also has motor dysfunction with respect to his toes, which is unchanged as well.  The patient is comfortable lying in bed at the current time.  Pain is exacerbated with motion,  again is relieved with rest and pain medication.  Located primarily about his right knee.  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  He has been contemplating getting his total knee replacement in the near future. The patient states that he was saving money and saving time off so that he can recuperate from this.  PAST MEDICAL HISTORY:  Notable for hypertension, which is well controlled, on hydrochlorothiazide.  PAST SURGICAL HISTORY:  Notable for multiple procedures to his right lower leg for nonunion.  FAMILY MEDICAL HISTORY:  Notable for heart disease in his father.  SOCIAL HISTORY:  The patient is a daily smoker about one pack per day. He does use alcohol social basis.  He is married and is employed.  MEDICATIONS:  Prior to admission include Ultram, HCTZ as well as lisinopril.  ALLERGIES:  No known drug allergies.  REVIEW OF SYSTEMS:  As noted above in the HPI.  PHYSICAL EXAMINATION:  VITAL SIGNS:  BP is 115/71, heart rate 76, respirations 16, 97% on room air, temperature 98.5, height 1.9 m, weight is 99.7 kg, BMI is 26.08 kg/m2.  I and O's reviewed. GENERAL:  The patient is a very pleasant 40 year old white  male, appropriate for stated age, conversant. HEENT:  Head is atraumatic.  Extraocular muscles are intact. NECK:  No spinous process tenderness.  Full range of motion is noted. LUNGS:  Clear to auscultation bilaterally. CARDIAC:  S1 and S2.  Regular rate and rhythm. ABDOMEN:  Soft, nontender, nondistended.  Positive bowel sounds are noted. PELVIS:  Pelvis is stable with AP and lateral compression.  No pain noted about his hips bilaterally. EXTREMITIES:  Examination of his right lower extremity, the patient is in a knee immobilizer, no compressive dressing has been applied.  His old surgical wounds are well healed.  No erythema.  Soft tissue is stable.  No open wounds or lesions are noted.  The patient has  an appreciable deformity to the right distal femur, significant knee effusion is appreciated.  Again, hip and ankle are unremarkable as they are nontender to palpation.  Palpable dorsalis pedis pulses noted. Extremity is warm.  Swelling is minimal.  The patient does not have any EHL function nor does he have any toe flexion and this is baseline and unchanged.  Ankle function is grossly preserved.  Knee stability not assessed due to his acute fracture.  LABORATORY DATA:  Sodium 132, potassium 3.3, chloride 101, bicarb 24, BUN 11, creatinine 0.80, glucose 122.  Hemoglobin 14.5, hematocrit 41.7, platelets 188, white blood cells 11.4.  IMAGING:  CT scan of his right femur demonstrates a severely comminuted right distal femur fracture with extensive metaphyseal involvement. Fracture planes do involve his joint.  The patient has significant DJD to his right knee as well.  ASSESSMENT AND PLAN:  A 40 year old male, ground-level fall with highly- comminuted right distal femur fracture. 1. Comminuted right distal femur fracture with preexisting end-stage     degenerative joint disease, right knee.  OR tomorrow for retrograde     IM nail of his right femur.  The goal of this operation would be to     restore his bone stock to allow for successful total knee     replacement in the near future.  Continue bed rest for now, as-     needed knee immobilizer. 2. Pain management.  Continue with current regimen including Tylenol,     oxycodone and Robaxin. 3. Hemodynamics were stable. 4. Medical issues.  Nicotine dependence.  We discussed at length.     Again, nicotine use in its impact on bone and wound healing.  The     patient is well aware of his healing issues given previous nonunion     of his right tibia. 5. Deep venous thrombosis and pulmonary embolism prophylaxis with foot     pumps for now, Lovenox postoperatively. 6. ID.  The patient will be placed on perioperative antibiotics. 7.  Metabolic bone disease.  This was a fairly low energy mechanism.     We will both check vitamin D levels and conduct further evaluation     based on that. 8. Activity, bed rest. 9. Fluids, electrolytes, nutrition and gastrointestinal prophylaxis.     Foley and lines.  Regular diet for now.  Protonix.  IV fluids.  The     patient will be n.p.o. after midnight. 10.Disposition.  OR tomorrow for retrograde IM nail, right femur.     Mearl Latin, PA-C   ______________________________ Doralee Albino. Carola Frost, M.D.    KWP/MEDQ  D:  09/23/2015  T:  09/24/2015  Job:  [id number]

## 2015-09-24 NOTE — Progress Notes (Signed)
PT Cancellation Note  Patient Details Name: Christian Bates MRN: [id number] DOB: [birth year]   Cancelled Treatment:    Reason Eval/Treat Not Completed: PT screened, no needs identified, will sign off. Patient has been up in room with crutches.  Understands NWB on RLE.  Has used crutches in past and feels he is doing well on them today.  Patient declines PT services.  PT will sign off.  **MD:  Please order crutches for home use - patient does not have crutches now (got rid of old crutches)   Vena AustriaDavis, Zannah Melucci H 09/24/2015, 5:53 PM Durenda HurtSusan H. Renaldo Fiddleravis, PT, [facility] Acute [facility] Pager [phone]

## 2015-09-24 NOTE — Brief Op Note (Signed)
09/23/2015 - 09/24/2015  12:30 PM  PATIENT:  Christian Bates  40 y.o. male  PRE-OPERATIVE DIAGNOSIS:  right distal femur fx, comminuted supracondylar w/o intraarticular extension  POST-OPERATIVE DIAGNOSIS:  right distal femur fx, comminuted supracondylar w/o intraarticular extension  PROCEDURE:  Procedure(s): INTRAMEDULLARY (IM) RETROGRADE FEMORAL NAILING (Right) with BIOMET PH[place]ENIX 12 X 440 STATICALLY LOCKED  SURGEON:  Surgeon(s) and Role:    * Myrene GalasMichael Florabelle Cardin, MD - Primary  PHYSICIAN ASSISTANT: Montez MoritaKeith Paul, PA-C  ANESTHESIA:   general  I/O:  Total I/O In: 1700 [I.V.:1700] Out: 790 [Urine:590; Blood:200]  SPECIMEN:  No Specimen  TOURNIQUET:  * No tourniquets in log *  DICTATION: .Other Dictation: Dictation Number [id number]

## 2015-09-24 NOTE — Transfer of Care (Signed)
Immediate Anesthesia Transfer of Care Note  Patient: Christian Bates  Procedure(s) Performed: Procedure(s): INTRAMEDULLARY (IM) RETROGRADE FEMORAL NAILING (Right)  Patient Location: PACU  Anesthesia Type:General  Level of Consciousness: awake  Airway & Oxygen Therapy: Patient Spontanous Breathing and Patient connected to nasal cannula oxygen  Post-op Assessment: Report given to RN and Post -op Vital signs reviewed and stable  Post vital signs: Reviewed and stable  Last Vitals:  Filed Vitals:   09/24/15 0501 09/24/15 0914  BP: 136/69 123/83  Pulse: 78 87  Temp: 37.3 C 37.2 C  Resp: 16 16    Complications: No apparent anesthesia complications

## 2015-09-24 NOTE — Op Note (Signed)
NAMNancie Neas:  Bates, Bates              ACCOUNT NO.:  [id number]  MEDICAL RECORD NO.:  [id number]  LOCATION:  5N12C                        FACILITY:  [facility]  PHYSICIAN:  Doralee AlbinoMichael H. Carola FrostHandy, M.D. DATE OF BIRTH:  May 23, 1976  DATE OF PROCEDURE:  09/24/2015 DATE OF DISCHARGE:                              OPERATIVE REPORT   PREOPERATIVE DIAGNOSIS:  Comminuted right supracondylar femur fracture.  POSTOPERATIVE DIAGNOSIS:  Comminuted right supracondylar femur fracture.  PROCEDURE:  Retrograde intramedullary nailing of the right femur using a Biomet [place] nail 12 x 440 mm, statically locked.  SURGEON:  Doralee AlbinoMichael H. Carola FrostHandy, M.D.  ASSISTANT:  Montez MoritaKeith Paul, PA-C.  ANESTHESIA:  General.  COMPLICATIONS:  None.  I/O:  1700 mL crystalloid/590 mL of urine, 250 mL of reamings.  DISPOSITION:  To PACU.  CONDITION:  Stable.  BRIEF SUMMARY AND INDICATIONS FOR PROCEDURE:  Christian Bates is a very pleasant, 40 year old male, who fell over a dog gate resulting in severely comminuted distal femur fracture.  He was initially seen and evaluated by Dr. Juanell FairlyKevin Krasinski at [facility] who contacted me after evaluation of the fracture, which was quite severe and distal, creating great concern over whether appropriate fixation could be obtained at that level with closed techniques and nailing. Consequently, I felt that it was outside his scope of practice and requested definitive management with a fellowship trained orthopedic traumatologist, which is why he contacted me and directed further care and management to me.  I received the patient in transfer at Dr. Samuel GermanyKrasinski's request and discussed with him the risks and benefits of nailing versus plating and recommended nailing using a newer construct that would allow for fixed angular control of the distal locks.  He acknowledged the risks to include infection, nerve injury, vessel injury, DVT, PE, loss of motion, progression of knee  arthritis, and need for eventual total knee arthroplasty as well as anesthesia and others.  BRIEF SUMMARY OF PROCEDURE:  The patient was taken to the operating room where general anesthesia was induced.  He did receive Ancef preoperatively.  His right lower extremity was prepped and draped in usual sterile fashion shaving the patient using chlorhexidine scrub and Betadine scrub and paint.  A 2 cm incision was made about the knee and then going medial to the patellar tendon.  The patient did have some BAHA from several prior knee surgeries and this also complicated the task technically, but we were able to overcome this with traction using a radiolucent triangle, several towel bumps, and then making serial adjustments in the knee flexion with bumps under the heel.  In this manner ultimately we were able to identify the correct trajectory and accommodate for the posttraumatic changes.  During guidewire advanced into the center of the distal femur, it was over drilled with the starting reamer and then sequential reaming performed of the femur, eventually placed a 12 mm nail having reamed to 13.5.  My assistant, Montez MoritaKeith Paul held traction as well as corrected deformities in both the AP and sagittal planes.  The knee was inserted so that the tip was just past Blumensaat's line.  There it was locked with 4 distal locks in the telescoping interlock slide, secured  to the distal end of the nail.  I then placed 2 proximal locks using perfect circle technique.  Final x- rays demonstrated appropriate reduction, hardware placement, trajectory, and length.  All wounds were irrigated thoroughly and closed in standard layered fashion.  [name] and I worked simultaneously with wound closure as well.  His assistance was absolutely necessary to control and maintain reduction in this very difficult comminuted distal fracture throughout the reaming, placement, and locking of the implants.  PROGNOSIS:  After  application of sterile gently compressive dressing, the patient was taken to the PACU in stable condition.  He will be nonweightbearing with unrestricted range of motion of the knee.  We anticipate 2 days in the hospital with office followup in 10 to 14 days for removal of sutures.  He will be on formal pharmacologic DVT prophylaxis.  Because of the severity of his comminution despite the low energy, we have already initiated an aggressive metabolic bone workup to assess his health and see if he may be a candidate for intervention here for future fracture prevention.     Doralee Albino. Carola Frost, M.D.     MHH/MEDQ  D:  09/24/2015  T:  09/24/2015  Job:  [id number]

## 2015-09-25 LAB — BASIC METABOLIC PANEL
ANION GAP: 10 (ref 5–15)
BUN: 10 mg/dL (ref 6–20)
CHLORIDE: 98 mmol/L — AB (ref 101–111)
CO2: 26 mmol/L (ref 22–32)
Calcium: 8.7 mg/dL — ABNORMAL LOW (ref 8.9–10.3)
Creatinine, Ser: 0.93 mg/dL (ref 0.61–1.24)
GFR calc Af Amer: >60 mL/min (ref >60)
GLUCOSE: 125 mg/dL — AB (ref 65–99)
POTASSIUM: 3.9 mmol/L (ref 3.5–5.1)
Sodium: 134 mmol/L — ABNORMAL LOW (ref 135–145)

## 2015-09-25 LAB — CBC
HEMATOCRIT: 28.7 % — AB (ref 39.0–52.0)
HEMOGLOBIN: 9.6 g/dL — AB (ref 13.0–17.0)
MCH: 29.9 pg (ref 26.0–34.0)
MCHC: 33.4 g/dL (ref 30.0–36.0)
MCV: 89.4 fL (ref 78.0–100.0)
Platelets: 144 10*3/uL — ABNORMAL LOW (ref 150–400)
RBC: 3.21 MIL/uL — ABNORMAL LOW (ref 4.22–5.81)
RDW: 12.3 % (ref 11.5–15.5)
WBC: 7.4 10*3/uL (ref 4.0–10.5)

## 2015-09-25 LAB — URINE CULTURE

## 2015-09-25 LAB — VITAMIN D 25 HYDROXY (VIT D DEFICIENCY, FRACTURES): Vit D, 25-Hydroxy: 14.9 ng/mL — ABNORMAL LOW (ref 30.0–100.0)

## 2015-09-25 MED ORDER — OXYCODONE HCL 5 MG PO TABS: 5.0000 mg | ORAL_TABLET | Freq: Four times a day (QID) | ORAL | Status: DC | PRN

## 2015-09-25 MED ORDER — ENOXAPARIN (LOVENOX) PATIENT EDUCATION KIT
PACK | Freq: Once | Status: AC
  Administered 2015-09-25: 11:00:00
  Filled 2015-09-25: qty 1

## 2015-09-25 MED ORDER — DOCUSATE SODIUM 100 MG PO CAPS: 100.0000 mg | ORAL_CAPSULE | Freq: Two times a day (BID) | ORAL | Status: DC

## 2015-09-25 MED ORDER — ENOXAPARIN SODIUM 40 MG/0.4ML ~~LOC~~ SOLN: 40.0000 mg | SUBCUTANEOUS | Status: DC

## 2015-09-25 MED ORDER — OXYCODONE-ACETAMINOPHEN 5-325 MG PO TABS
1.0000 | ORAL_TABLET | Freq: Four times a day (QID) | ORAL | Status: AC | PRN
Start: 2015-09-25 — End: ?

## 2015-09-25 MED ORDER — METHOCARBAMOL 500 MG PO TABS
500.0000 mg | ORAL_TABLET | Freq: Four times a day (QID) | ORAL | Status: AC | PRN
Start: 2015-09-25 — End: ?

## 2015-09-25 NOTE — Progress Notes (Signed)
Discharge instructions given. Pt verbalized understanding and all questions were answered.  

## 2015-09-25 NOTE — Progress Notes (Signed)
Orthopedic Tech Progress Note Patient Details:  Crissie FiguresBelvin L Sutch Sep 29, 1975 [id number]  Ortho Devices Type of Ortho Device:  (footsie roll) Ortho Device/Splint Location: rle Ortho Device/Splint Interventions: Application Pt stated that he is capable of getting both in and out of bed without use of the trapeze bar patient helper  Nikki DomCrawford, Hasana Alcorta 09/25/2015, 11:59 AM

## 2015-09-25 NOTE — Progress Notes (Signed)
OT Cancellation Note  Patient Details Name: Christian Bates MRN: [id number] DOB: 11-Nov-1975   Cancelled Treatment:    Reason Eval/Treat Not Completed: OT screened, no needs identified, will sign off. Pt has been up in room with crutches and has used them several times before. Pt also reports having necessary DME (shower seat) and verbalized understanding of precautions, compensatory strategies, and fall prevention strategies. Pt declines OT services. OT signing off.  Nils PyleJulia Edy Belt, OTR/L Pager: [phone] 09/25/2015, 8:49 AM

## 2015-09-25 NOTE — Discharge Summary (Signed)
Orthopaedic Trauma Service (OTS)  Patient ID: Christian Bates MRN: [id number] DOB/AGE: April 24, 1976 40 y.o.  Admit date: 09/23/2015 Discharge date: 09/25/2015  Admission Diagnoses: Closed right distal femur fracture Hypertension  Discharge Diagnoses:  Principal Problem:   Fracture, femur, distal, right, closed, initial encounter Active Problems:   HTN (hypertension)   Procedures Performed: 09/24/2015- Dr. Marcelino Scot  Retrograde intramedullary nailing of the right femur using a [employer] [place] nail 12 x 440 mm, statically locked  Discharged Condition: good  Hospital Course:   40 year old white male well known to the orthopedic trauma service for right tibia nonunion repair approximate 6 years ago presented to [facility] on 09/23/2015 after sustaining a ground-level fall while tripping over a dog gate. Patient sustained a comminuted right distal femur fracture. He was initially seen at [facility] however the fracture pattern was felt to be too severe to treat there and it was felt that an orthopedic traumatologist was necessary for definitive fixation. As such the patient was transferred to [facility] to Dr. Carlean Jews service. Patient was taken to the operating room on 09/24/2015 for the procedure noted above. Patient tolerated the procedure very well after surgery he was transferred back to the PACU for recovery from anesthesia and transferred up to the orthopedic floor for continued observation, pain control and to begin therapies. Patient was started on Lovenox on postoperative day #1 for DVT and PE prophylaxis. He mobilize very well on postoperative day #1 with PT and OT. He had adequate pain control with oral pain medications. He was tolerating regular diet and voiding without difficulty on postoperative day #1 as well. As such patient was deemed to be stable for discharge on postoperative day #1. In addition I given the low energy mechanism of his fracture  vitamin D labs were ordered and are pending at the time of discharge. We will follow up on these labs and order additional metabolic bone labs as dictated based on his vitamin D levels. In the interim patient can start vitamin D3 4000 IUs daily until further notice.  Consults: None  Significant Diagnostic Studies: labs:   Results for Christian Bates, Christian Bates (MRN [id number]) as of 09/25/2015 10:20  Ref. Range 09/25/2015 05:39  Sodium Latest Ref Range: 135-145 mmol/L 134 (L)  Potassium Latest Ref Range: 3.5-5.1 mmol/L 3.9  Chloride Latest Ref Range: 101-111 mmol/L 98 (L)  CO2 Latest Ref Range: 22-32 mmol/L 26  BUN Latest Ref Range: 6-20 mg/dL 10  Creatinine Latest Ref Range: 0.61-1.24 mg/dL 0.93  Calcium Latest Ref Range: 8.9-10.3 mg/dL 8.7 (L)  EGFR (Non-African Amer.) Latest Ref Range: >60 mL/min >60  EGFR (African American) Latest Ref Range: >60 mL/min >60  Glucose Latest Ref Range: 65-99 mg/dL 125 (H)  Anion gap Latest Ref Range: 5-15  10  WBC Latest Ref Range: 4.0-10.5 K/uL 7.4  RBC Latest Ref Range: 4.22-5.81 MIL/uL 3.21 (L)  Hemoglobin Latest Ref Range: 13.0-17.0 g/dL 9.6 (L)  HCT Latest Ref Range: 39.0-52.0 % 28.7 (L)  MCV Latest Ref Range: 78.0-100.0 fL 89.4  MCH Latest Ref Range: 26.0-34.0 pg 29.9  MCHC Latest Ref Range: 30.0-36.0 g/dL 33.4  RDW Latest Ref Range: 11.5-15.5 % 12.3  Platelets Latest Ref Range: 150-400 K/uL 144 (L)   25-hydroxy vitamin D is pending  Treatments: IV hydration, antibiotics: Ancef, analgesia: Tylenol, OxyIR, anticoagulation: LMW heparin, therapies: PT, OT and RN and surgery: As above  Discharge Exam:        Orthopaedic Trauma Service Progress Note  Subjective   Doing Fantastic Ready to go home, patient already dressed No specific complaints Does report some right knee pain  Review of Systems  Constitutional: Negative for fever and chills.  Eyes: Negative for blurred vision.  Respiratory: Negative for shortness of breath and wheezing.    Cardiovascular: Negative for chest pain and palpitations.  Gastrointestinal: Negative for nausea and vomiting.  Genitourinary: Negative for dysuria and urgency.  Neurological: Negative for tingling and headaches.     Objective   BP 120/63 mmHg  Pulse 76  Temp(Src) 98.3 F (36.8 C) (Oral)  Resp 16  Ht _0  (1.956 m)  Wt 99.79 kg (220 lb)  BMI 26.08 kg/m2  SpO2 100%  Intake/Output       04/09 0701 - 04/10 0700 04/10 0701 - 04/11 0700    P.O. 150 240    I.V. (mL/kg) 1800 (18)     Total Intake(mL/kg) 1950 (19.5) 240 (2.4)    Urine (mL/kg/hr) 2040 (0.9)     Blood 200 (0.1)     Total Output 2240      Net -290 +240            Labs Results for Christian Bates, Christian Bates (MRN [id number]) as of 09/25/2015 10:20   Ref. Range  09/25/2015 05:39   Sodium  Latest Ref Range: 135-145 mmol/L  134 (L)   Potassium  Latest Ref Range: 3.5-5.1 mmol/L  3.9   Chloride  Latest Ref Range: 101-111 mmol/L  98 (L)   CO2  Latest Ref Range: 22-32 mmol/L  26   BUN  Latest Ref Range: 6-20 mg/dL  10   Creatinine  Latest Ref Range: 0.61-1.24 mg/dL  0.93   Calcium  Latest Ref Range: 8.9-10.3 mg/dL  8.7 (L)   EGFR (Non-African Amer.)  Latest Ref Range: >60 mL/min  >60   EGFR (African American)  Latest Ref Range: >60 mL/min  >60   Glucose  Latest Ref Range: 65-99 mg/dL  125 (H)   Anion gap  Latest Ref Range: 5-15   10   WBC  Latest Ref Range: 4.0-10.5 K/uL  7.4   RBC  Latest Ref Range: 4.22-5.81 MIL/uL  3.21 (L)   Hemoglobin  Latest Ref Range: 13.0-17.0 g/dL  9.6 (L)   HCT  Latest Ref Range: 39.0-52.0 %  28.7 (L)   MCV  Latest Ref Range: 78.0-100.0 fL  89.4   MCH  Latest Ref Range: 26.0-34.0 pg  29.9   MCHC  Latest Ref Range: 30.0-36.0 g/dL  33.4   RDW  Latest Ref Range: 11.5-15.5 %  12.3   Platelets  Latest Ref Range: 150-400 K/uL  144 (L)      Exam  Gen: Awake and alert. Sitting up in bed, dressed go home, no acute distress Lungs: Breathing is unlabored Cardiac: Regular rate and rhythm Ext:         Right lower extremity             Dressings are clean dry and intact             Extremity is warm             + DP pulse             No deep calf tenderness             Motor and sensory functions are intact baseline    Assessment and Plan    POD/HD#: 34  40 year old white male status post ground level fall with highly  comminuted right distal femur fracture  - comminuted R distal femur fracture with pre-existing endstage DJD R knee s/p retrograde intramedullary nailing              activity as tolerated             Nonweightbearing right leg 6-8 weeks             Unrestricted knee and hip range of motion             PT and OT evaluations             Ice, Ace wrap, elevation for swelling control              patient can discontinue his knee immobilizer  - Pain management:             Continue with current regimen              We'll discharge home with Percocet and OxyIR. Also will include Robaxin  - ABL anemia/Hemodynamics             Stable   - Medical issues               Nicotine dependence                         Discussed nicotine use. 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   - DVT/PE prophylaxis:              Lovenox 14 days - ID:               periop abx completed  - Metabolic Bone Disease:              vitamin D levels are pending             Suspect some type of deficiency             Will have patient start vitamin D3 4000 IUs daily             Additional workup to be dictated based on labs             May need to consider anabolic agent such as Forteo during this healing phase to encourage adequate healing and strength given that patient is anticipating total knee replacement in the near future  - Activity:              as tolerated  - FEN/GI prophylaxis/Foley/Lines:             Reg diet              - Dispo:             DC home today             Follow-up in 2 weeks     Disposition: Home       Discharge  Instructions    Call MD / Call 911    Complete by:  As directed   If you experience chest pain or shortness of breath, CALL 911 and be transported to the hospital emergency room.  If you develope a fever above 101 F, pus (white drainage) or increased drainage or redness at the wound, or calf pain, call your surgeon's office.     Constipation Prevention    Complete by:  As directed   Drink plenty of fluids.  Prune juice may be helpful.  You may use a stool softener, such as Colace (over the counter) 100 mg twice a day.  Use MiraLax (over the counter) for constipation as needed.     Diet general    Complete by:  As directed      Discharge instructions    Complete by:  As directed   Orthopaedic Trauma Service Discharge Instructions   General Discharge Instructions  WEIGHT BEARING STATUS: Nonweightbearing right leg  RANGE OF MOTION/ACTIVITY: Unrestricted range of motion right knee and hip  Wound Care: Daily dressing changes starting on 09/27/2015. Please see instructions below Discharge Wound Care Instructions  Do NOT apply any ointments, solutions or lotions to pin sites or surgical wounds.  These prevent needed drainage and even though solutions like hydrogen peroxide kill bacteria, they also damage cells lining the pin sites that help fight infection.  Applying lotions or ointments can keep the wounds moist and can cause them to breakdown and open up as well. This can increase the risk for infection. When in doubt call the office.  Surgical incisions should be dressed daily.  If any drainage is noted, use one layer of adaptic, then gauze, Kerlix, and an ace wrap.  Once the incision is completely dry and without drainage, it may be left open to air out.  Showering may begin 36-48 hours later.  Cleaning gently with soap and water.  Traumatic wounds should be dressed daily as well.    One layer of adaptic, gauze, Kerlix, then ace wrap.  The adaptic can be discontinued once the draining has  ceased    If you have a wet to dry dressing: wet the gauze with saline the squeeze as much saline out so the gauze is moist (not soaking wet), place moistened gauze over wound, then place a dry gauze over the moist one, followed by Kerlix wrap, then ace wrap.  PAIN MEDICATION USE AND EXPECTATIONS  You have likely been given narcotic medications to help control your pain.  After a traumatic event that results in an fracture (broken bone) with or without surgery, it is ok to use narcotic pain medications to help control one's pain.  We understand that everyone responds to pain differently and each individual patient will be evaluated on a regular basis for the continued need for narcotic medications. Ideally, narcotic medication use should last no more than 6-8 weeks (coinciding with fracture healing).   As a patient it is your responsibility as well to monitor narcotic medication use and report the amount and frequency you use these medications when you come to your office visit.   We would also advise that if you are using narcotic medications, you should take a dose prior to therapy to maximize you participation.  IF YOU ARE ON NARCOTIC MEDICATIONS IT IS NOT PERMISSIBLE TO OPERATE A MOTOR VEHICLE (MOTORCYCLE/CAR/TRUCK/MOPED) OR HEAVY MACHINERY DO NOT MIX NARCOTICS WITH OTHER CNS (CENTRAL NERVOUS SYSTEM) DEPRESSANTS SUCH AS ALCOHOL  Diet: as you were eating previously.  Can use over the counter stool softeners and bowel preparations, such as Miralax, to help with bowel movements.  Narcotics can be constipating.  Be sure to drink plenty of fluids    STOP SMOKING OR USING NICOTINE PRODUCTS!!!!  As discussed nicotine severely impairs your body's ability to heal surgical and traumatic wounds but also impairs bone healing.  Wounds and bone heal by forming microscopic blood vessels (angiogenesis) and nicotine is a vasoconstrictor (essentially, shrinks blood vessels).  Therefore, if vasoconstriction occurs  to these microscopic blood  vessels they essentially disappear and are unable to deliver necessary nutrients to the healing tissue.  This is one modifiable factor that you can do to dramatically increase your chances of healing your injury.    (This means no smoking, no nicotine gum, patches, etc)  DO NOT USE NONSTEROIDAL ANTI-INFLAMMATORY DRUGS (NSAID'S)  Using products such as Advil (ibuprofen), Aleve (naproxen), Motrin (ibuprofen) for additional pain control during fracture healing can delay and/or prevent the healing response.  If you would like to take over the counter (OTC) medication, Tylenol (acetaminophen) is ok.  However, some narcotic medications that are given for pain control contain acetaminophen as well. Therefore, you should not exceed more than 4000 mg of tylenol in a day if you do not have liver disease.  Also note that there are may OTC medicines, such as cold medicines and allergy medicines that my contain tylenol as well.  If you have any questions about medications and/or interactions please ask your doctor/PA or your pharmacist.      ICE AND ELEVATE INJURED/OPERATIVE EXTREMITY  Using ice and elevating the injured extremity above your heart can help with swelling and pain control.  Icing in a pulsatile fashion, such as 20 minutes on and 20 minutes off, can be followed.    Do not place ice directly on skin. Make sure there is a barrier between to skin and the ice pack.    Using frozen items such as frozen peas works well as the conform nicely to the are that needs to be iced.  USE AN ACE WRAP OR TED HOSE FOR SWELLING CONTROL  In addition to icing and elevation, Ace wraps or TED hose are used to help limit and resolve swelling.  It is recommended to use Ace wraps or TED hose until you are informed to stop.    When using Ace Wraps start the wrapping distally (farthest away from the body) and wrap proximally (closer to the body)   Example: If you had surgery on your leg or thing and  you do not have a splint on, start the ace wrap at the toes and work your way up to the thigh        If you had surgery on your upper extremity and do not have a splint on, start the ace wrap at your fingers and work your way up to the upper arm  IF YOU ARE IN A SPLINT OR CAST DO NOT [place]   If your splint gets wet for any reason please contact the office immediately. You may shower in your splint or cast as long as you keep it dry.  This can be done by wrapping in a cast cover or garbage back (or similar)  Do Not stick any thing down your splint or cast such as pencils, money, or hangers to try and scratch yourself with.  If you feel itchy take benadryl as prescribed on the bottle for itching  IF YOU ARE IN A CAM BOOT (BLACK BOOT)  You may remove boot periodically. Perform daily dressing changes as noted below.  Wash the liner of the boot regularly and wear a sock when wearing the boot. It is recommended that you sleep in the boot until told otherwise  CALL THE OFFICE WITH ANY QUESTIONS OR CONCERTS: [phone]     Do not put a pillow under the knee. Place it under the heel.    Complete by:  As directed      Driving restrictions  Complete by:  As directed   No driving     Increase activity slowly as tolerated    Complete by:  As directed      Non weight bearing    Complete by:  As directed   Laterality:  right  Extremity:  Lower            Medication List    TAKE these medications        docusate sodium 100 MG capsule  Commonly known as:  COLACE  Take 1 capsule (100 mg total) by mouth 2 (two) times daily.     enoxaparin 40 MG/0.4ML injection  Commonly known as:  LOVENOX  Inject 0.4 mLs (40 mg total) into the skin daily.     lisinopril-hydrochlorothiazide 10-12.5 MG tablet  Commonly known as:  PRINZIDE,ZESTORETIC  Take 1 tablet by mouth daily.     methocarbamol 500 MG tablet  Commonly known as:  ROBAXIN  Take 1-2 tablets (500-1,000 mg total) by mouth  every 6 (six) hours as needed for muscle spasms.     oxyCODONE 5 MG immediate release tablet  Commonly known as:  Oxy IR/ROXICODONE  Take 1-3 tablets (5-15 mg total) by mouth every 6 (six) hours as needed for breakthrough pain (take between percocet).     oxyCODONE-acetaminophen 5-325 MG tablet  Commonly known as:  ROXICET  Take 1-2 tablets by mouth every 6 (six) hours as needed for moderate pain or severe pain.     traMADol 50 MG tablet  Commonly known as:  ULTRAM  Take 50 mg by mouth every 6 (six) hours as needed for severe pain.       Follow-up Information    Follow up with HANDY,MICHAEL H, MD. Schedule an appointment as soon as possible for a visit in 2 weeks.   Specialty:  Orthopedic Surgery   Why:  For suture removal, For wound re-check   Contact information:   [place] [address] [phone]       Discharge Instructions and Plan: 40 year old white male status post ground level fall with highly comminuted right distal femur fracture  - comminuted R distal femur fracture with pre-existing endstage DJD R knee s/p retrograde intramedullary nailing              activity as tolerated             Nonweightbearing right leg 6-8 weeks             Unrestricted knee and hip range of motion             PT and OT evaluations             Ice, Ace wrap, elevation for swelling control              patient can discontinue his knee immobilizer  - Pain management:             Continue with current regimen              We'll discharge home with Percocet and OxyIR. Also will include Robaxin  - ABL anemia/Hemodynamics             Stable   - Medical issues               Nicotine dependence                         Discussed nicotine use. Pt well  aware of healing issues given previous nonunion of R tibia   - DVT/PE prophylaxis:              Lovenox 14 days - ID:               periop abx completed  - Metabolic Bone Disease:              vitamin D  levels are pending             Suspect some type of deficiency             Will have patient start vitamin D3 4000 IUs daily             Additional workup to be dictated based on labs             May need to consider anabolic agent such as Forteo during this healing phase to encourage adequate healing and strength given that patient is anticipating total knee replacement in the near future  - Activity:              as tolerated  - FEN/GI prophylaxis/Foley/Lines:             Reg diet              - Dispo:             DC home today             Follow-up in 2 weeks   Signed:  Jari Pigg, PA-C [facility] [phone] (P) 09/25/2015, 10:46 AM

## 2015-09-25 NOTE — Discharge Instructions (Signed)
[facility] Discharge Instructions   General Discharge Instructions  WEIGHT BEARING STATUS: Nonweightbearing right leg  RANGE OF MOTION/ACTIVITY: Unrestricted range of motion right knee and hip  Wound Care: Daily dressing changes starting on 09/27/2015. Please see instructions below Discharge Wound Care Instructions  Do NOT apply any ointments, solutions or lotions to pin sites or surgical wounds.  These prevent needed drainage and even though solutions like hydrogen peroxide kill bacteria, they also damage cells lining the pin sites that help fight infection.  Applying lotions or ointments can keep the wounds moist and can cause them to breakdown and open up as well. This can increase the risk for infection. When in doubt call the office.  Surgical incisions should be dressed daily.  If any drainage is noted, use one layer of adaptic, then gauze, Kerlix, and an ace wrap.  Once the incision is completely dry and without drainage, it may be left open to air out.  Showering may begin 36-48 hours later.  Cleaning gently with soap and water.  Traumatic wounds should be dressed daily as well.    One layer of adaptic, gauze, Kerlix, then ace wrap.  The adaptic can be discontinued once the draining has ceased    If you have a wet to dry dressing: wet the gauze with saline the squeeze as much saline out so the gauze is moist (not soaking wet), place moistened gauze over wound, then place a dry gauze over the moist one, followed by Kerlix wrap, then ace wrap.  PAIN MEDICATION USE AND EXPECTATIONS  You have likely been given narcotic medications to help control your pain.  After a traumatic event that results in an fracture (broken bone) with or without surgery, it is ok to use narcotic pain medications to help control one's pain.  We understand that everyone responds to pain differently and each individual patient will be evaluated on a regular basis for the continued need for  narcotic medications. Ideally, narcotic medication use should last no more than 6-8 weeks (coinciding with fracture healing).   As a patient it is your responsibility as well to monitor narcotic medication use and report the amount and frequency you use these medications when you come to your office visit.   We would also advise that if you are using narcotic medications, you should take a dose prior to therapy to maximize you participation.  IF YOU ARE ON NARCOTIC MEDICATIONS IT IS NOT PERMISSIBLE TO OPERATE A MOTOR VEHICLE (MOTORCYCLE/CAR/TRUCK/MOPED) OR HEAVY MACHINERY DO NOT MIX NARCOTICS WITH OTHER CNS (CENTRAL NERVOUS SYSTEM) DEPRESSANTS SUCH AS ALCOHOL  Diet: as you were eating previously.  Can use over the counter stool softeners and bowel preparations, such as Miralax, to help with bowel movements.  Narcotics can be constipating.  Be sure to drink plenty of fluids    STOP SMOKING OR USING NICOTINE PRODUCTS!!!!  As discussed nicotine severely impairs your body's ability to heal surgical and traumatic wounds but also impairs bone healing.  Wounds and bone heal by forming microscopic blood vessels (angiogenesis) and nicotine is a vasoconstrictor (essentially, shrinks blood vessels).  Therefore, if vasoconstriction occurs to these microscopic blood vessels they essentially disappear and are unable to deliver necessary nutrients to the healing tissue.  This is one modifiable factor that you can do to dramatically increase your chances of healing your injury.    (This means no smoking, no nicotine gum, patches, etc)  DO NOT USE NONSTEROIDAL ANTI-INFLAMMATORY DRUGS (NSAID'S)  Using products such as  Advil (ibuprofen), Aleve (naproxen), Motrin (ibuprofen) for additional pain control during fracture healing can delay and/or prevent the healing response.  If you would like to take over the counter (OTC) medication, Tylenol (acetaminophen) is ok.  However, some narcotic medications that are given for  pain control contain acetaminophen as well. Therefore, you should not exceed more than 4000 mg of tylenol in a day if you do not have liver disease.  Also note that there are may OTC medicines, such as cold medicines and allergy medicines that my contain tylenol as well.  If you have any questions about medications and/or interactions please ask your doctor/PA or your pharmacist.      ICE AND ELEVATE INJURED/OPERATIVE EXTREMITY  Using ice and elevating the injured extremity above your heart can help with swelling and pain control.  Icing in a pulsatile fashion, such as 20 minutes on and 20 minutes off, can be followed.    Do not place ice directly on skin. Make sure there is a barrier between to skin and the ice pack.    Using frozen items such as frozen peas works well as the conform nicely to the are that needs to be iced.  USE AN ACE WRAP OR TED HOSE FOR SWELLING CONTROL  In addition to icing and elevation, Ace wraps or TED hose are used to help limit and resolve swelling.  It is recommended to use Ace wraps or TED hose until you are informed to stop.    When using Ace Wraps start the wrapping distally (farthest away from the body) and wrap proximally (closer to the body)   Example: If you had surgery on your leg or thing and you do not have a splint on, start the ace wrap at the toes and work your way up to the thigh        If you had surgery on your upper extremity and do not have a splint on, start the ace wrap at your fingers and work your way up to the upper arm  IF YOU ARE IN A SPLINT OR CAST DO NOT REMOVE IT FOR ANY REASON   If your splint gets wet for any reason please contact the office immediately. You may shower in your splint or cast as long as you keep it dry.  This can be done by wrapping in a cast cover or garbage back (or similar)  Do Not stick any thing down your splint or cast such as pencils, money, or hangers to try and scratch yourself with.  If you feel itchy take benadryl as  prescribed on the bottle for itching  IF YOU ARE IN A CAM BOOT (BLACK BOOT)  You may remove boot periodically. Perform daily dressing changes as noted below.  Wash the liner of the boot regularly and wear a sock when wearing the boot. It is recommended that you sleep in the boot until told otherwise  CALL THE OFFICE WITH ANY QUESTIONS OR CONCERTS: [phone]

## 2015-09-25 NOTE — Progress Notes (Signed)
[facility] Progress Note  Subjective   Doing Fantastic Ready to go home, patient already dressed No specific complaints Does report some right knee pain  Review of Systems  Constitutional: Negative for fever and chills.  Eyes: Negative for blurred vision.  Respiratory: Negative for shortness of breath and wheezing.   Cardiovascular: Negative for chest pain and palpitations.  Gastrointestinal: Negative for nausea and vomiting.  Genitourinary: Negative for dysuria and urgency.  Neurological: Negative for tingling and headaches.     Objective   BP 120/63 mmHg  Pulse 76  Temp(Src) 98.3 F (36.8 C) (Oral)  Resp 16  Ht '6\' 5"'$  (1.956 m)  Wt 99.79 kg (220 lb)  BMI 26.08 kg/m2  SpO2 100%  Intake/Output      04/09 0701 - 04/10 0700 04/10 0701 - 04/11 0700   P.O. 150 240   I.V. (mL/kg) 1800 (18)    Total Intake(mL/kg) 1950 (19.5) 240 (2.4)   Urine (mL/kg/hr) 2040 (0.9)    Blood 200 (0.1)    Total Output 2240     Net -290 +240          Labs Results for Christian Bates, Christian Bates (MRN [id number]) as of 09/25/2015 10:20  Ref. Range 09/25/2015 05:39  Sodium Latest Ref Range: 135-145 mmol/L 134 (L)  Potassium Latest Ref Range: 3.5-5.1 mmol/L 3.9  Chloride Latest Ref Range: 101-111 mmol/L 98 (L)  CO2 Latest Ref Range: 22-32 mmol/L 26  BUN Latest Ref Range: 6-20 mg/dL 10  Creatinine Latest Ref Range: 0.61-1.24 mg/dL 0.93  Calcium Latest Ref Range: 8.9-10.3 mg/dL 8.7 (L)  EGFR (Non-African Amer.) Latest Ref Range: >60 mL/min >60  EGFR (African American) Latest Ref Range: >60 mL/min >60  Glucose Latest Ref Range: 65-99 mg/dL 125 (H)  Anion gap Latest Ref Range: 5-15  10  WBC Latest Ref Range: 4.0-10.5 K/uL 7.4  RBC Latest Ref Range: 4.22-5.81 MIL/uL 3.21 (L)  Hemoglobin Latest Ref Range: 13.0-17.0 g/dL 9.6 (L)  HCT Latest Ref Range: 39.0-52.0 % 28.7 (L)  MCV Latest Ref Range: 78.0-100.0 fL 89.4  MCH Latest Ref Range: 26.0-34.0 pg 29.9  MCHC Latest Ref Range:  30.0-36.0 g/dL 33.4  RDW Latest Ref Range: 11.5-15.5 % 12.3  Platelets Latest Ref Range: 150-400 K/uL 144 (L)     Exam  Gen: Awake and alert. Sitting up in bed, dressed go home, no acute distress Lungs: Breathing is unlabored Cardiac: Regular rate and rhythm Ext:       Right lower extremity  Dressings are clean dry and intact  Extremity is warm  + DP pulse  No deep calf tenderness  Motor and sensory functions are intact baseline    Assessment and Plan   POD/HD#: 41  40 year old white male status post ground level fall with highly comminuted right distal femur fracture  - comminuted R distal femur fracture with pre-existing endstage DJD R knee s/p retrograde intramedullary nailing              activity as tolerated  Nonweightbearing right leg 6-8 weeks  Unrestricted knee and hip range of motion  PT and OT evaluations  Ice, Ace wrap, elevation for swelling control              patient can discontinue his knee immobilizer  - Pain management:             Continue with current regimen   We'll discharge home with Percocet and OxyIR. Also will include Robaxin  - ABL anemia/Hemodynamics  Stable   - Medical issues               Nicotine dependence                         Discussed nicotine use. 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   - DVT/PE prophylaxis:              Lovenox 14 days - ID:               periop abx completed  - Metabolic Bone Disease:              vitamin D levels are pending  Suspect some type of deficiency  Will have patient start vitamin D3 4000 IUs daily  Additional workup to be dictated based on labs  May need to consider anabolic agent such as Forteo during this healing phase to encourage adequate healing and strength given that patient is anticipating total knee replacement in the near future  - Activity:              as tolerated  - FEN/GI prophylaxis/Foley/Lines:             Reg diet               - Dispo:  DC home today  Follow-up in 2 weeks    Jari Pigg, PA-C [facility] [phone] ([phone] (O) 09/25/2015 10:17 AM

## 2015-09-26 ENCOUNTER — Encounter (HOSPITAL_COMMUNITY): Payer: Self-pay | Admitting: Orthopedic Surgery

## 2015-09-27 ENCOUNTER — Encounter (HOSPITAL_COMMUNITY): Payer: Self-pay | Admitting: Orthopedic Surgery

## 2016-12-09 IMAGING — CR DG FEMUR 2+V PORT*R*
4 series · 4 of 4 positions shown · non-contrast
Comparison: None.

CLINICAL DATA: Fracture.

EXAM:
RIGHT FEMUR PORTABLE 1 VIEW

[xtable lateral (1 of 2)]
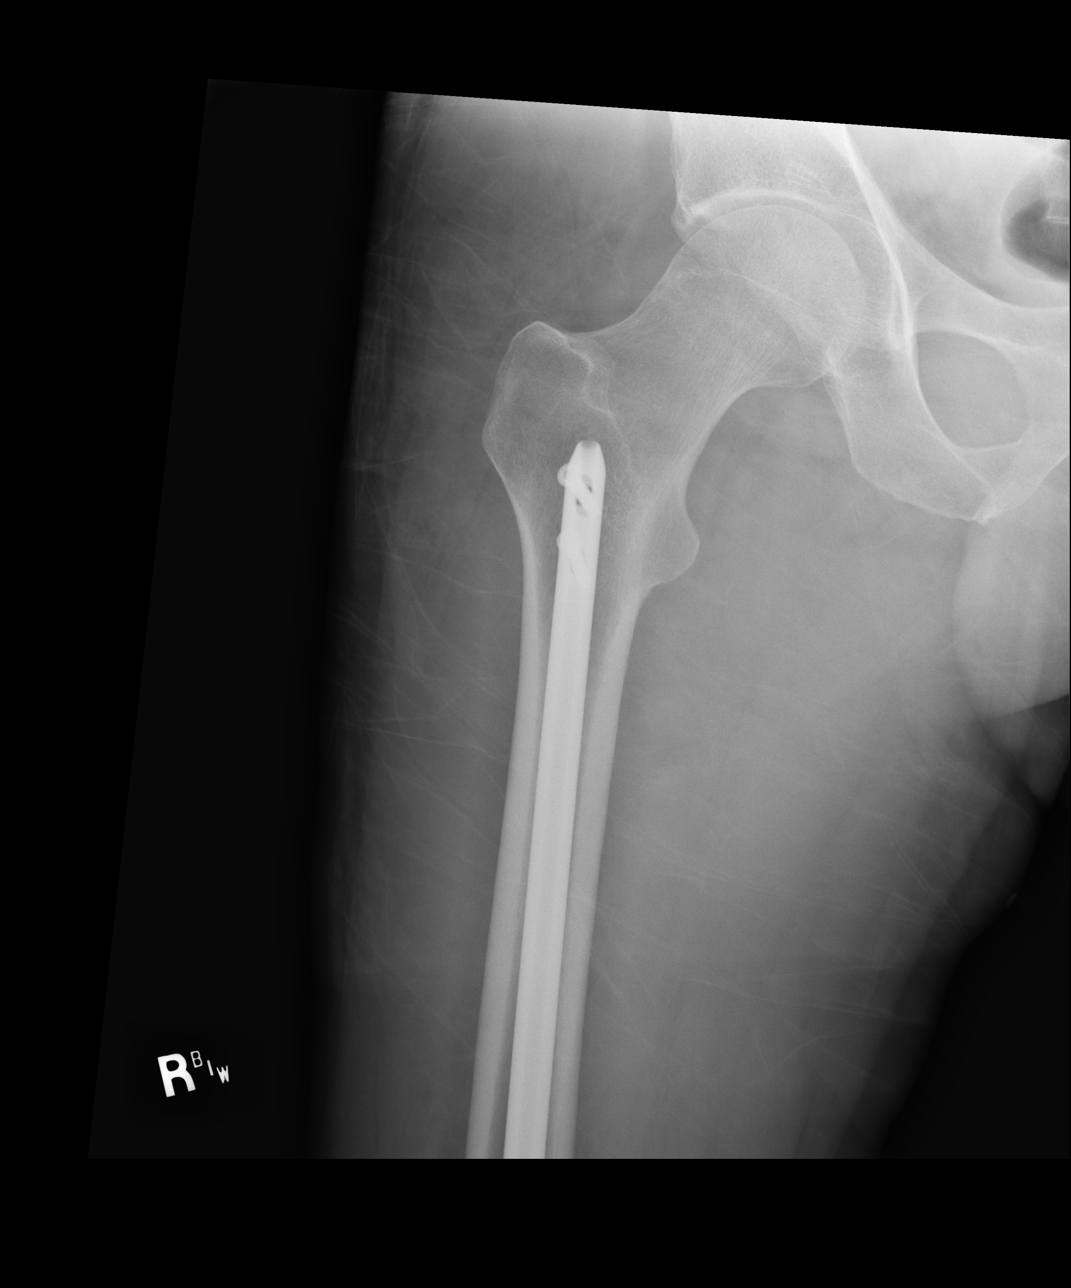

[AP (1 of 2)]
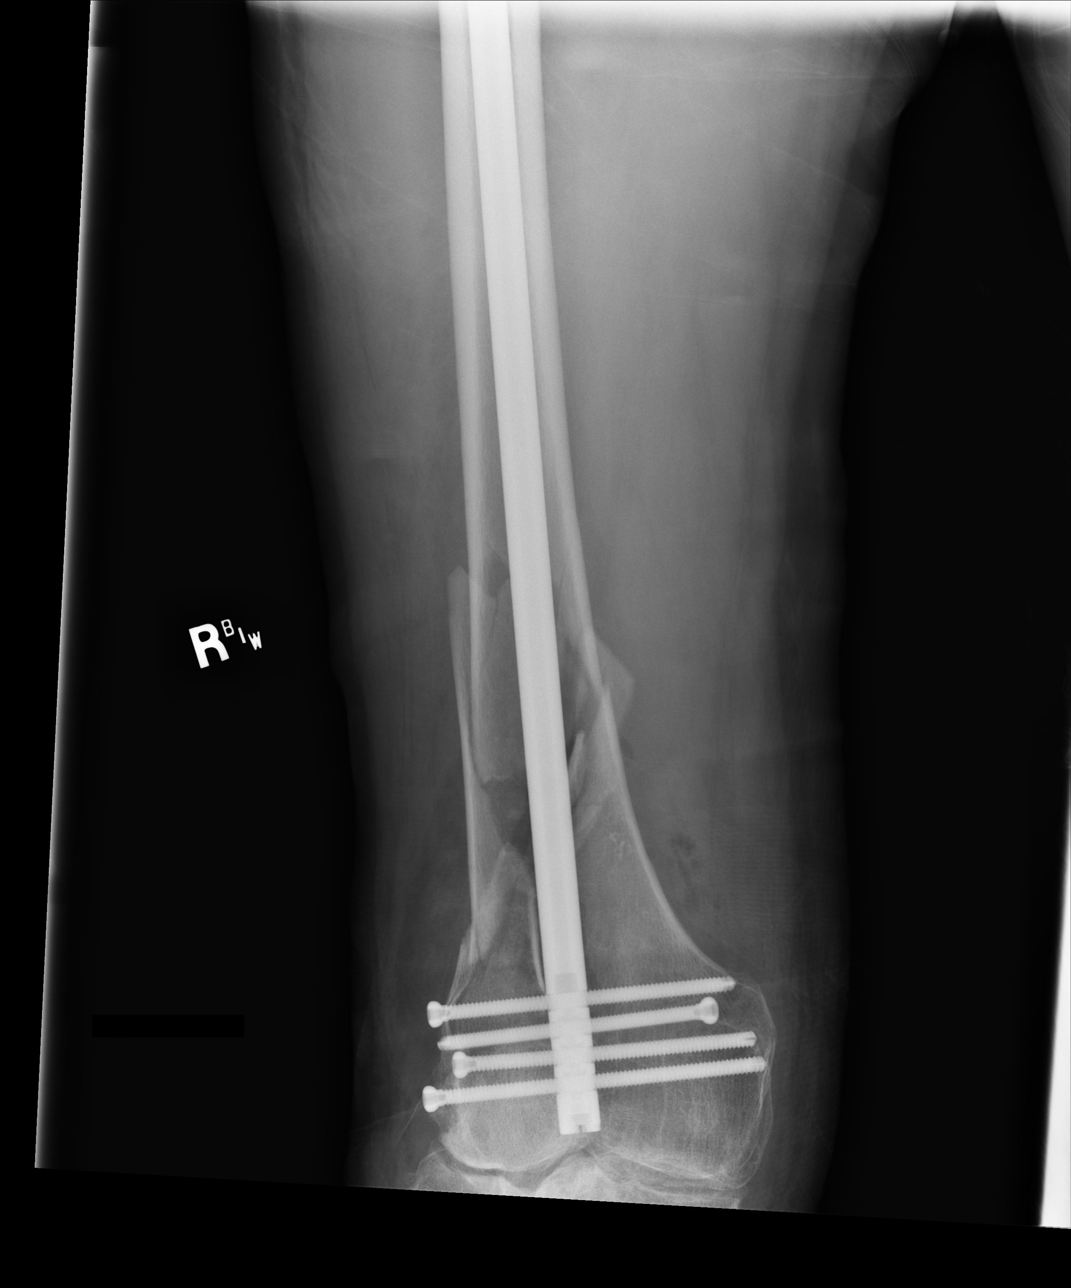

[AP (2 of 2)]
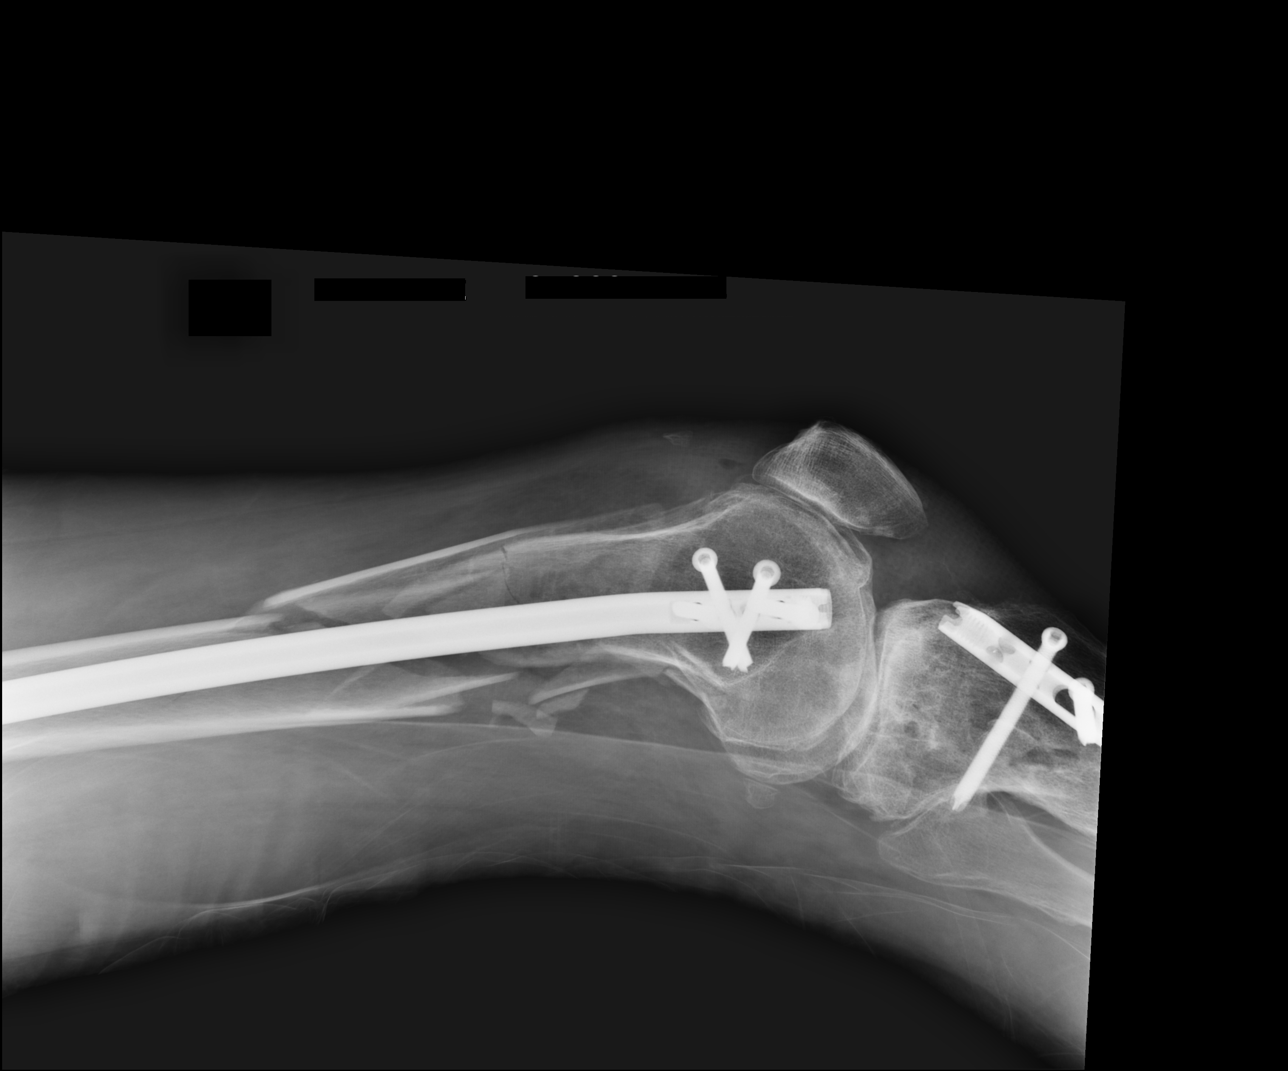

[xtable lateral (2 of 2)]
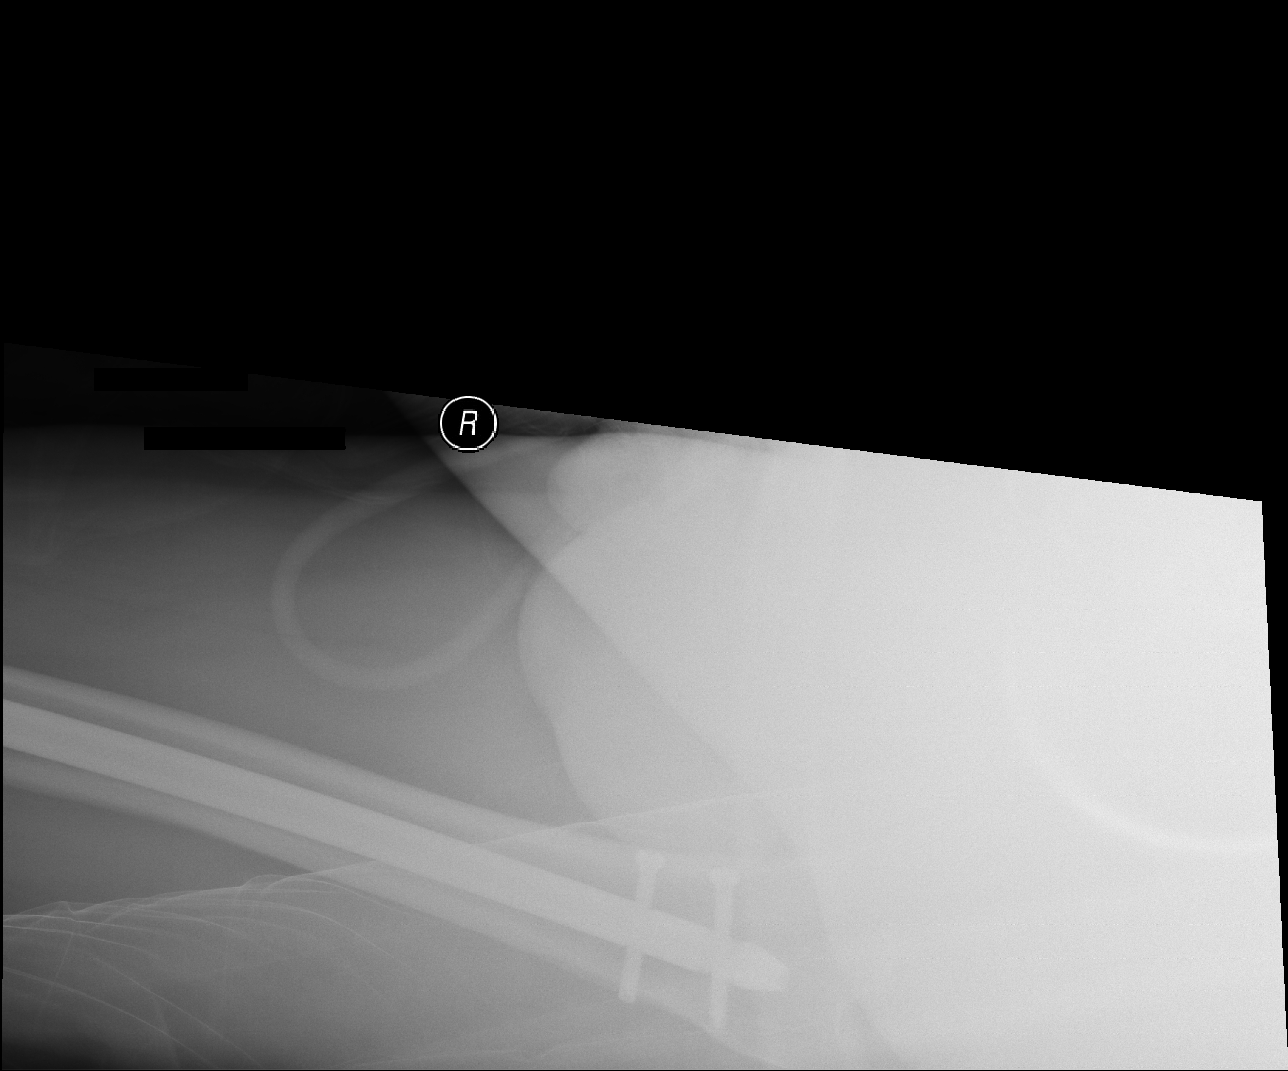

[4 of 4 positions shown; findings below may reference images not displayed]

FINDINGS: There is a comminuted fracture deformity involving the distal shaft
of the femur. The patient has undergone open reduction internal
fixation with IM nail and screw device. The fracture fragments
appear to be in near anatomic alignment.
IMPRESSION: Status post ORIF of distal femur fracture.

## 2020-06-23 ENCOUNTER — Other Ambulatory Visit: Payer: Self-pay

## 2020-06-23 DIAGNOSIS — Z20822 Contact with and (suspected) exposure to covid-19: Secondary | ICD-10-CM

## 2020-06-27 LAB — NOVEL CORONAVIRUS, NAA: SARS-CoV-2, NAA: NOT DETECTED

## 2023-03-14 ENCOUNTER — Observation Stay
Admit: 2023-03-14 | Discharge: 2023-03-14 | Disposition: A | Discharge status: Home / Self Care | Payer: BC Managed Care – PPO | Attending: Medical | Admitting: Medical

## 2023-03-14 ENCOUNTER — Emergency Department: Payer: BC Managed Care – PPO

## 2023-03-14 ENCOUNTER — Inpatient Hospital Stay
Admission: EM | Admit: 2023-03-14 | Discharge: 2023-03-15 | DRG: 322 | Disposition: A | Discharge status: Home / Self Care | Payer: BC Managed Care – PPO | Attending: Internal Medicine | Admitting: Internal Medicine

## 2023-03-14 ENCOUNTER — Other Ambulatory Visit: Payer: Self-pay

## 2023-03-14 ENCOUNTER — Encounter
Admission: EM | Disposition: A | Discharge status: Home / Self Care | Payer: Self-pay | Source: Home / Self Care | Attending: Internal Medicine

## 2023-03-14 DIAGNOSIS — F1090 Alcohol use, unspecified, uncomplicated: Secondary | ICD-10-CM | POA: Diagnosis present

## 2023-03-14 DIAGNOSIS — I5A Non-ischemic myocardial injury (non-traumatic): Secondary | ICD-10-CM | POA: Diagnosis not present

## 2023-03-14 DIAGNOSIS — I700 Atherosclerosis of aorta: Secondary | ICD-10-CM | POA: Diagnosis not present

## 2023-03-14 DIAGNOSIS — R9431 Abnormal electrocardiogram [ECG] [EKG]: Secondary | ICD-10-CM | POA: Diagnosis not present

## 2023-03-14 DIAGNOSIS — I2583 Coronary atherosclerosis due to lipid rich plaque: Secondary | ICD-10-CM | POA: Diagnosis not present

## 2023-03-14 DIAGNOSIS — F1010 Alcohol abuse, uncomplicated: Secondary | ICD-10-CM | POA: Diagnosis not present

## 2023-03-14 DIAGNOSIS — Z720 Tobacco use: Secondary | ICD-10-CM | POA: Diagnosis present

## 2023-03-14 DIAGNOSIS — I214 Non-ST elevation (NSTEMI) myocardial infarction: Principal | ICD-10-CM | POA: Diagnosis present

## 2023-03-14 DIAGNOSIS — Z91128 Patient's intentional underdosing of medication regimen for other reason: Secondary | ICD-10-CM

## 2023-03-14 DIAGNOSIS — Z1152 Encounter for screening for COVID-19: Secondary | ICD-10-CM

## 2023-03-14 DIAGNOSIS — I161 Hypertensive emergency: Principal | ICD-10-CM | POA: Diagnosis present

## 2023-03-14 DIAGNOSIS — E7800 Pure hypercholesterolemia, unspecified: Secondary | ICD-10-CM | POA: Diagnosis not present

## 2023-03-14 DIAGNOSIS — I10 Essential (primary) hypertension: Secondary | ICD-10-CM | POA: Diagnosis not present

## 2023-03-14 DIAGNOSIS — I2510 Atherosclerotic heart disease of native coronary artery without angina pectoris: Secondary | ICD-10-CM | POA: Diagnosis present

## 2023-03-14 DIAGNOSIS — R9389 Abnormal findings on diagnostic imaging of other specified body structures: Secondary | ICD-10-CM | POA: Diagnosis not present

## 2023-03-14 DIAGNOSIS — F17210 Nicotine dependence, cigarettes, uncomplicated: Secondary | ICD-10-CM | POA: Diagnosis present

## 2023-03-14 DIAGNOSIS — Z789 Other specified health status: Secondary | ICD-10-CM | POA: Diagnosis present

## 2023-03-14 DIAGNOSIS — R918 Other nonspecific abnormal finding of lung field: Secondary | ICD-10-CM | POA: Diagnosis not present

## 2023-03-14 DIAGNOSIS — Z886 Allergy status to analgesic agent status: Secondary | ICD-10-CM | POA: Diagnosis not present

## 2023-03-14 DIAGNOSIS — R079 Chest pain, unspecified: Secondary | ICD-10-CM | POA: Diagnosis not present

## 2023-03-14 DIAGNOSIS — Z955 Presence of coronary angioplasty implant and graft: Secondary | ICD-10-CM | POA: Diagnosis not present

## 2023-03-14 DIAGNOSIS — Z8249 Family history of ischemic heart disease and other diseases of the circulatory system: Secondary | ICD-10-CM | POA: Diagnosis not present

## 2023-03-14 HISTORY — DX: Alcohol use, unspecified, uncomplicated: F10.90

## 2023-03-14 HISTORY — DX: Other specified health status: Z78.9

## 2023-03-14 HISTORY — DX: Tobacco use: Z72.0

## 2023-03-14 HISTORY — PX: CORONARY STENT INTERVENTION: CATH118234

## 2023-03-14 HISTORY — PX: LEFT HEART CATH AND CORONARY ANGIOGRAPHY: CATH118249

## 2023-03-14 LAB — ETHANOL: Alcohol, Ethyl (B): <10 mg/dL (ref <10)

## 2023-03-14 LAB — BASIC METABOLIC PANEL
Anion gap: 9 (ref 5–15)
BUN: 11 mg/dL (ref 6–20)
CO2: 22 mmol/L (ref 22–32)
Calcium: 8.9 mg/dL (ref 8.9–10.3)
Chloride: 107 mmol/L (ref 98–111)
Creatinine, Ser: 0.88 mg/dL (ref 0.61–1.24)
GFR, Estimated: >60 mL/min (ref >60)
Glucose, Bld: 118 mg/dL — ABNORMAL HIGH (ref 70–99)
Potassium: 3.5 mmol/L (ref 3.5–5.1)
Sodium: 138 mmol/L (ref 135–145)

## 2023-03-14 LAB — TROPONIN I (HIGH SENSITIVITY)
Troponin I (High Sensitivity): 112 ng/L (ref <18)
Troponin I (High Sensitivity): 519 ng/L (ref <18)

## 2023-03-14 LAB — CBC
HCT: 44.3 % (ref 39.0–52.0)
Hemoglobin: 15.3 g/dL (ref 13.0–17.0)
MCH: 31.2 pg (ref 26.0–34.0)
MCHC: 34.5 g/dL (ref 30.0–36.0)
MCV: 90.2 fL (ref 80.0–100.0)
Platelets: 166 10*3/uL (ref 150–400)
RBC: 4.91 MIL/uL (ref 4.22–5.81)
RDW: 12.3 % (ref 11.5–15.5)
WBC: 7.1 10*3/uL (ref 4.0–10.5)
nRBC: 0 % (ref 0.0–0.2)

## 2023-03-14 LAB — APTT: aPTT: 30 s (ref 24–36)

## 2023-03-14 LAB — POCT ACTIVATED CLOTTING TIME
Activated Clotting Time: 269 s
Activated Clotting Time: 342 s

## 2023-03-14 LAB — LIPID PANEL
Cholesterol: 174 mg/dL (ref 0–200)
HDL: 41 mg/dL (ref >40)
LDL Cholesterol: 111 mg/dL — ABNORMAL HIGH (ref 0–99)
Total CHOL/HDL Ratio: 4.2 {ratio}
Triglycerides: 111 mg/dL (ref <150)
VLDL: 22 mg/dL (ref 0–40)

## 2023-03-14 LAB — PROTIME-INR
INR: 1 (ref 0.8–1.2)
Prothrombin Time: 13.4 s (ref 11.4–15.2)

## 2023-03-14 LAB — HEMOGLOBIN A1C
Hgb A1c MFr Bld: 5.7 % — ABNORMAL HIGH (ref 4.8–5.6)
Mean Plasma Glucose: 116.89 mg/dL

## 2023-03-14 SURGERY — LEFT HEART CATH AND CORONARY ANGIOGRAPHY
Anesthesia: Moderate Sedation

## 2023-03-14 MED ORDER — SODIUM CHLORIDE 0.9% FLUSH
3.0000 mL | Freq: Two times a day (BID) | INTRAVENOUS | Status: DC
  Administered 2023-03-14 – 2023-03-15 (×2): 3 mL via INTRAVENOUS

## 2023-03-14 MED ORDER — NIFEDIPINE ER OSMOTIC RELEASE 30 MG PO TB24
30.0000 mg | ORAL_TABLET | Freq: Once | ORAL | Status: AC
  Administered 2023-03-14: 30 mg via ORAL
  Filled 2023-03-14: qty 1

## 2023-03-14 MED ORDER — IOHEXOL 300 MG/ML  SOLN
INTRAMUSCULAR | Status: DC | PRN
  Administered 2023-03-14: 105 mL

## 2023-03-14 MED ORDER — SODIUM CHLORIDE 0.9 % WEIGHT BASED INFUSION: 3.0000 mL/kg/h | INTRAVENOUS | Status: DC

## 2023-03-14 MED ORDER — FENTANYL CITRATE (PF) 100 MCG/2ML IJ SOLN
INTRAMUSCULAR | Status: DC | PRN
  Administered 2023-03-14 (×2): 25 ug via INTRAVENOUS

## 2023-03-14 MED ORDER — FENTANYL CITRATE (PF) 100 MCG/2ML IJ SOLN
INTRAMUSCULAR | Status: AC
  Filled 2023-03-14: qty 2

## 2023-03-14 MED ORDER — FOLIC ACID 1 MG PO TABS
1.0000 mg | ORAL_TABLET | Freq: Every day | ORAL | Status: DC
  Administered 2023-03-14 – 2023-03-15 (×2): 1 mg via ORAL
  Filled 2023-03-14 (×2): qty 1

## 2023-03-14 MED ORDER — LABETALOL HCL 5 MG/ML IV SOLN: 10.0000 mg | INTRAVENOUS | Status: AC | PRN

## 2023-03-14 MED ORDER — SODIUM CHLORIDE 0.9 % IV SOLN: INTRAVENOUS | Status: AC

## 2023-03-14 MED ORDER — METOPROLOL TARTRATE 25 MG PO TABS
12.5000 mg | ORAL_TABLET | Freq: Two times a day (BID) | ORAL | Status: DC
  Administered 2023-03-14 – 2023-03-15 (×2): 12.5 mg via ORAL
  Filled 2023-03-14 (×2): qty 1

## 2023-03-14 MED ORDER — SODIUM CHLORIDE 0.9% FLUSH: 3.0000 mL | INTRAVENOUS | Status: DC | PRN

## 2023-03-14 MED ORDER — ENOXAPARIN SODIUM 40 MG/0.4ML IJ SOSY
40.0000 mg | PREFILLED_SYRINGE | INTRAMUSCULAR | Status: DC
  Administered 2023-03-15: 40 mg via SUBCUTANEOUS
  Filled 2023-03-14: qty 0.4

## 2023-03-14 MED ORDER — VERAPAMIL HCL 2.5 MG/ML IV SOLN
INTRAVENOUS | Status: AC
  Filled 2023-03-14: qty 2

## 2023-03-14 MED ORDER — HEPARIN SODIUM (PORCINE) 1000 UNIT/ML IJ SOLN
INTRAMUSCULAR | Status: AC
  Filled 2023-03-14: qty 10

## 2023-03-14 MED ORDER — LIDOCAINE HCL (PF) 1 % IJ SOLN
INTRAMUSCULAR | Status: DC | PRN
  Administered 2023-03-14: 2 mL

## 2023-03-14 MED ORDER — SODIUM CHLORIDE 0.9 % IV SOLN: 250.0000 mL | INTRAVENOUS | Status: DC | PRN

## 2023-03-14 MED ORDER — HYDRALAZINE HCL 20 MG/ML IJ SOLN: 5.0000 mg | INTRAMUSCULAR | Status: DC | PRN

## 2023-03-14 MED ORDER — HEPARIN (PORCINE) 25000 UT/250ML-% IV SOLN
1400.0000 [IU]/h | INTRAVENOUS | Status: DC
  Administered 2023-03-14: 1400 [IU]/h via INTRAVENOUS
  Filled 2023-03-14: qty 250

## 2023-03-14 MED ORDER — MIDAZOLAM HCL 2 MG/2ML IJ SOLN
INTRAMUSCULAR | Status: AC
  Filled 2023-03-14: qty 2

## 2023-03-14 MED ORDER — PRASUGREL HCL 10 MG PO TABS
ORAL_TABLET | ORAL | Status: DC | PRN
  Administered 2023-03-14: 60 mg via ORAL

## 2023-03-14 MED ORDER — TIROFIBAN (AGGRASTAT) BOLUS VIA INFUSION
INTRAVENOUS | Status: DC | PRN
  Administered 2023-03-14: 2592.5 ug via INTRAVENOUS

## 2023-03-14 MED ORDER — IBUPROFEN 400 MG PO TABS: 200.0000 mg | ORAL_TABLET | Freq: Four times a day (QID) | ORAL | Status: DC | PRN

## 2023-03-14 MED ORDER — ONDANSETRON HCL 4 MG/2ML IJ SOLN: 4.0000 mg | Freq: Three times a day (TID) | INTRAMUSCULAR | Status: DC | PRN

## 2023-03-14 MED ORDER — NICOTINE 21 MG/24HR TD PT24
21.0000 mg | MEDICATED_PATCH | Freq: Every day | TRANSDERMAL | Status: DC
  Filled 2023-03-14 (×2): qty 1

## 2023-03-14 MED ORDER — TRAMADOL HCL 50 MG PO TABS
50.0000 mg | ORAL_TABLET | Freq: Once | ORAL | Status: AC
  Administered 2023-03-14: 50 mg via ORAL

## 2023-03-14 MED ORDER — NITROGLYCERIN PEDIATRIC IV INFUSION 200 MCG/ML: 25.5250 ug/min | INTRAVENOUS | Status: DC

## 2023-03-14 MED ORDER — ENOXAPARIN SODIUM 40 MG/0.4ML IJ SOSY: 40.0000 mg | PREFILLED_SYRINGE | INTRAMUSCULAR | Status: DC

## 2023-03-14 MED ORDER — NITROGLYCERIN IN D5W 200-5 MCG/ML-% IV SOLN
0.0000 ug/min | INTRAVENOUS | Status: DC
  Administered 2023-03-14: 5 ug/min via INTRAVENOUS
  Filled 2023-03-14: qty 250

## 2023-03-14 MED ORDER — THIAMINE MONONITRATE 100 MG PO TABS
100.0000 mg | ORAL_TABLET | Freq: Every day | ORAL | Status: DC
  Administered 2023-03-14 – 2023-03-15 (×2): 100 mg via ORAL
  Filled 2023-03-14 (×2): qty 1

## 2023-03-14 MED ORDER — ASPIRIN 81 MG PO CHEW: 81.0000 mg | CHEWABLE_TABLET | ORAL | Status: DC

## 2023-03-14 MED ORDER — LORAZEPAM 2 MG/ML IJ SOLN: 0.0000 mg | Freq: Four times a day (QID) | INTRAMUSCULAR | Status: DC

## 2023-03-14 MED ORDER — HEPARIN (PORCINE) IN NACL 1000-0.9 UT/500ML-% IV SOLN
INTRAVENOUS | Status: DC | PRN
  Administered 2023-03-14 (×2): 500 mL

## 2023-03-14 MED ORDER — HEPARIN BOLUS VIA INFUSION
4000.0000 [IU] | Freq: Once | INTRAVENOUS | Status: AC
  Administered 2023-03-14: 4000 [IU] via INTRAVENOUS
  Filled 2023-03-14: qty 4000

## 2023-03-14 MED ORDER — NITROGLYCERIN IN D5W 200-5 MCG/ML-% IV SOLN
25.0000 ug/min | INTRAVENOUS | Status: DC
  Administered 2023-03-14: 10 ug/min via INTRAVENOUS

## 2023-03-14 MED ORDER — ASPIRIN 81 MG PO CHEW
324.0000 mg | CHEWABLE_TABLET | Freq: Once | ORAL | Status: AC
  Administered 2023-03-14: 324 mg via ORAL
  Filled 2023-03-14: qty 4

## 2023-03-14 MED ORDER — TRAMADOL HCL 50 MG PO TABS
ORAL_TABLET | ORAL | Status: AC
  Filled 2023-03-14: qty 1

## 2023-03-14 MED ORDER — VERAPAMIL HCL 2.5 MG/ML IV SOLN
INTRAVENOUS | Status: DC | PRN
  Administered 2023-03-14 (×2): 2.5 mg via INTRA_ARTERIAL

## 2023-03-14 MED ORDER — TIROFIBAN HCL IV 12.5 MG/250 ML
INTRAVENOUS | Status: AC
  Filled 2023-03-14: qty 250

## 2023-03-14 MED ORDER — TIROFIBAN HCL IV 12.5 MG/250 ML
INTRAVENOUS | Status: DC | PRN
  Administered 2023-03-14: .15 ug/kg/min via INTRAVENOUS

## 2023-03-14 MED ORDER — PRASUGREL HCL 10 MG PO TABS
10.0000 mg | ORAL_TABLET | Freq: Every day | ORAL | Status: DC
  Administered 2023-03-15: 10 mg via ORAL
  Filled 2023-03-14: qty 1

## 2023-03-14 MED ORDER — SODIUM CHLORIDE 0.9 % WEIGHT BASED INFUSION: 1.0000 mL/kg/h | INTRAVENOUS | Status: DC

## 2023-03-14 MED ORDER — NITROGLYCERIN 1 MG/10 ML FOR IR/CATH LAB
INTRA_ARTERIAL | Status: AC
  Filled 2023-03-14: qty 10

## 2023-03-14 MED ORDER — MIDAZOLAM HCL 2 MG/2ML IJ SOLN
INTRAMUSCULAR | Status: DC | PRN
  Administered 2023-03-14: 1 mg via INTRAVENOUS

## 2023-03-14 MED ORDER — HEPARIN (PORCINE) IN NACL 1000-0.9 UT/500ML-% IV SOLN
INTRAVENOUS | Status: AC
  Filled 2023-03-14: qty 1000

## 2023-03-14 MED ORDER — HYDRALAZINE HCL 20 MG/ML IJ SOLN: 10.0000 mg | INTRAMUSCULAR | Status: AC | PRN

## 2023-03-14 MED ORDER — MORPHINE SULFATE (PF) 2 MG/ML IV SOLN: 2.0000 mg | INTRAVENOUS | Status: DC | PRN

## 2023-03-14 MED ORDER — HYDRALAZINE HCL 20 MG/ML IJ SOLN
10.0000 mg | Freq: Once | INTRAMUSCULAR | Status: AC
  Administered 2023-03-14: 10 mg via INTRAVENOUS
  Filled 2023-03-14: qty 1

## 2023-03-14 MED ORDER — PRASUGREL HCL 10 MG PO TABS
ORAL_TABLET | ORAL | Status: AC
  Filled 2023-03-14: qty 6

## 2023-03-14 MED ORDER — HEPARIN SODIUM (PORCINE) 1000 UNIT/ML IJ SOLN
INTRAMUSCULAR | Status: DC | PRN
  Administered 2023-03-14 (×2): 5000 [IU] via INTRAVENOUS

## 2023-03-14 MED ORDER — ATORVASTATIN CALCIUM 20 MG PO TABS
40.0000 mg | ORAL_TABLET | Freq: Every day | ORAL | Status: DC
  Administered 2023-03-14 – 2023-03-15 (×2): 40 mg via ORAL
  Filled 2023-03-14 (×2): qty 2

## 2023-03-14 MED ORDER — LOSARTAN POTASSIUM 50 MG PO TABS
25.0000 mg | ORAL_TABLET | Freq: Once | ORAL | Status: AC
  Administered 2023-03-14: 25 mg via ORAL
  Filled 2023-03-14: qty 1

## 2023-03-14 MED ORDER — IOHEXOL 350 MG/ML SOLN
75.0000 mL | Freq: Once | INTRAVENOUS | Status: AC | PRN
  Administered 2023-03-14: 75 mL via INTRAVENOUS

## 2023-03-14 MED ORDER — ASPIRIN 81 MG PO TBEC
81.0000 mg | DELAYED_RELEASE_TABLET | Freq: Every day | ORAL | Status: DC
  Administered 2023-03-15: 81 mg via ORAL
  Filled 2023-03-14: qty 1

## 2023-03-14 MED ORDER — TIROFIBAN HCL IN NACL 5-0.9 MG/100ML-% IV SOLN
0.0750 ug/kg/min | INTRAVENOUS | Status: AC
  Filled 2023-03-14: qty 100

## 2023-03-14 MED ORDER — THIAMINE HCL 100 MG/ML IJ SOLN: 100.0000 mg | Freq: Every day | INTRAMUSCULAR | Status: DC

## 2023-03-14 MED ORDER — ADULT MULTIVITAMIN W/MINERALS CH
1.0000 | ORAL_TABLET | Freq: Every day | ORAL | Status: DC
  Administered 2023-03-14 – 2023-03-15 (×2): 1 via ORAL
  Filled 2023-03-14 (×2): qty 1

## 2023-03-14 MED ORDER — LORAZEPAM 2 MG/ML IJ SOLN: 0.0000 mg | Freq: Two times a day (BID) | INTRAMUSCULAR | Status: DC

## 2023-03-14 MED ORDER — NITROGLYCERIN 1 MG/10 ML FOR IR/CATH LAB
INTRA_ARTERIAL | Status: DC | PRN
  Administered 2023-03-14 (×2): 200 ug via INTRACORONARY

## 2023-03-14 SURGICAL SUPPLY — 19 items
BALLN TREK RX 2.25X12 (BALLOONS) ×1
BALLN ~~LOC~~ TREK NEO RX 3.0X12 (BALLOONS) ×1
BALLOON TREK RX 2.25X12 (BALLOONS) IMPLANT
BALLOON ~~LOC~~ TREK NEO RX 3.0X12 (BALLOONS) IMPLANT
CATH 5FR JL3.5 JR4 ANG PIG MP (CATHETERS) IMPLANT
CATH LAUNCHER 6FR EBU3.5 (CATHETERS) IMPLANT
DEVICE RAD TR BAND REGULAR (VASCULAR PRODUCTS) IMPLANT
DRAPE BRACHIAL (DRAPES) IMPLANT
GLIDESHEATH SLEND SS 6F .021 (SHEATH) IMPLANT
GUIDEWIRE INQWIRE 1.5J.035X260 (WIRE) IMPLANT
INQWIRE 1.5J .035X260CM (WIRE) ×1
KIT ENCORE 26 ADVANTAGE (KITS) IMPLANT
PACK CARDIAC CATH (CUSTOM PROCEDURE TRAY) ×1 IMPLANT
PROTECTION STATION PRESSURIZED (MISCELLANEOUS) ×1
SET ATX-X65L (MISCELLANEOUS) IMPLANT
STATION PROTECTION PRESSURIZED (MISCELLANEOUS) IMPLANT
STENT ONYX FRONTIER 2.5X15 (Permanent Stent) IMPLANT
TUBING CIL FLEX 10 FLL-RA (TUBING) IMPLANT
WIRE RUNTHROUGH .014X180CM (WIRE) IMPLANT

## 2023-03-14 NOTE — ED Notes (Signed)
Cardiologist at bedside.  

## 2023-03-14 NOTE — ED Notes (Signed)
Special's RN to bedside to transfer pt.

## 2023-03-14 NOTE — ED Triage Notes (Signed)
Pt sent from urgent care for top CP radiating to shoulders which started this morning. Pt ambulatory.

## 2023-03-14 NOTE — ED Notes (Signed)
Per MD End, increase nitroglycerin drip to 15 mcg/min.

## 2023-03-14 NOTE — ED Notes (Signed)
Nitroglycerin drip titrated back down to 10 mcg/min to maintain ordered goal systolic BP.

## 2023-03-14 NOTE — ED Notes (Signed)
Pt unhooked from monitor to use restroom. Pt with steady gait and walking with IV pole.

## 2023-03-14 NOTE — Consult Note (Signed)
ANTICOAGULATION CONSULT NOTE   Pharmacy Consult for Heparin Indication: chest pain/ACS  Allergies  Allergen Reactions   Acetaminophen Other (See Comments)    At large doses pt gets Gi upset    Patient Measurements: Height: 6\' 6"  (198.1 cm) Weight: 103.7 kg (228 lb 9.9 oz) IBW/kg (Calculated) : 91.4 Heparin Dosing Weight: 102.1 kg  Vital Signs: Temp: 98.1 F (36.7 C) (09/27 0940) Temp Source: Oral (09/27 0940) BP: 144/92 (09/27 1310) Pulse Rate: 65 (09/27 1310)  Labs: Recent Labs    03/14/23 0947 03/14/23 1136  HGB 15.3  --   HCT 44.3  --   PLT 166  --   APTT 30  --   LABPROT 13.4  --   INR 1.0  --   CREATININE 0.88  --   TROPONINIHS 112* 519*    Estimated Creatinine Clearance: 132.7 mL/min (by C-G formula based on SCr of 0.88 mg/dL).   Medical History: Past Medical History:  Diagnosis Date   Alcohol use    Hypertension    Tobacco abuse     Medications:  (Not in a hospital admission)  Scheduled:   aspirin  324 mg Oral Once   [START ON 03/15/2023] aspirin EC  81 mg Oral Daily   enoxaparin (LOVENOX) injection  40 mg Subcutaneous Q24H   folic acid  1 mg Oral Daily   LORazepam  0-4 mg Intravenous Q6H   Followed by   Melene Muller ON 03/16/2023] LORazepam  0-4 mg Intravenous Q12H   multivitamin with minerals  1 tablet Oral Daily   nicotine  21 mg Transdermal Daily   thiamine  100 mg Oral Daily   Or   thiamine  100 mg Intravenous Daily   Infusions:   nitroGLYCERIN 10 mcg/min (03/14/23 1246)   PRN: hydrALAZINE, ibuprofen, morphine injection, ondansetron (ZOFRAN) IV Anti-infectives (From admission, onward)    None       Assessment: Patient presenting with chest pain. Pharmacy consulted to start heparin for ACS. Trop trending up. No DOAC PTA.   Goal of Therapy:  Heparin level 0.3-0.7 units/ml Monitor platelets by anticoagulation protocol: Yes   Plan:  Give 4000 units bolus x 1 Start heparin infusion at 1400 units/hr Check anti-Xa level in 6 hours  and daily while on heparin Continue to monitor H&H and platelets  Ronnald Ramp, PharmD, BCPS 03/14/2023,1:14 PM

## 2023-03-14 NOTE — Progress Notes (Signed)
TR band removed and dressed at 1905, hematoma noted around stick site but stable. PT ambulated to and from restroom and upon return had significant swelling. Immediatley initiated manual pressure and paged Dr. Duke Salvia. Pressure held for 20 minutes while maintaining patent wave SPO2 waveform on R thumb. After pressure released, pt had a reverse barbeau of "A". No new orders regarding radial site although still moderate swelling around stick site but wrist more soft. Site and pt stable at this time.

## 2023-03-14 NOTE — ED Notes (Signed)
Admission MD at bedside. MD advised to imitate nitroglycerin at 61mcg/min to maintain pt's current BP.

## 2023-03-14 NOTE — H&P (View-Only) (Signed)
Cardiology Consultation   Patient ID: Christian Bates MRN: [id number]; DOB: 11-09-1975  Admit date: 03/14/2023 Date of Consult: 03/14/2023  PCP:  Oneita Hurt, No   [facility] Providers Cardiologist:New   Patient Profile:   Christian Bates is a 47 y.o. male with a hx of alcohol abuse (recently quit 03/08/23), HTN who is being seen 03/14/2023 for the evaluation of NSTEMI at the request of Dr. Clyde Lundborg.  History of Present Illness:   Christian Bates has not been seen by cardiology in the past. Patient says he had a diagnosis of HTN, but has not been on medications in about 6 years. He reports he recently quit drinking alcohol. He was drinking heavily for 12 years, in the last 4 years he was 8-10 beer daily. He quit a week ago. He denies drug history. He smokes 1/2 ppd. His father had CABG x5 and a pacemaker.   The patient presented to the ER with chest pain.pain started this morning. It was a pressure that radiated across his shoulders. He had nausea and vomiting. No SOB. Pain was 7/10 and persisted, so he went to the ER. He denies LLE, palpitations, fever or chills.   In the ER BP 192/130, pulse 82, RR 20, afebrile. Labs showed K3.5, Scr 0.88, BG 118, normal CBC. HS troponin 112>519. EKG showed NSR with subtle ST dep anterior leads. CXR negative. CTA chest negative for aneurysm or dissection, possible atelectasis vs inflammatory vs infectious process left lower lobe. Patient was given ASA, , Iv hydralazine, losartan and procardia, and started on IV nitroglycerin and Iv heparin.    Past Medical History:  Diagnosis Date   Alcohol use    Hypertension    Tobacco abuse     Past Surgical History:  Procedure Laterality Date   FEMUR IM NAIL Right 09/24/2015   Procedure: INTRAMEDULLARY (IM) RETROGRADE FEMORAL NAILING;  Surgeon: Myrene Galas, MD;  Location: [facility] OR;  Service: Orthopedics;  Laterality: Right;   LEG SURGERY       Home Medications:  Prior to Admission medications   Medication Sig  Start Date End Date Taking? Authorizing Provider  docusate sodium (COLACE) 100 MG capsule Take 1 capsule (100 mg total) by mouth 2 (two) times daily. 09/25/15  Yes Montez Morita, PA-C  enoxaparin (LOVENOX) 40 MG/0.4ML injection Inject 0.4 mLs (40 mg total) into the skin daily. Patient not taking: Reported on 03/14/2023 09/25/15   Montez Morita, PA-C  lisinopril-hydrochlorothiazide (PRINZIDE,ZESTORETIC) 10-12.5 MG tablet Take 1 tablet by mouth daily. Patient not taking: Reported on 03/14/2023 09/13/15   [provider]  methocarbamol (ROBAXIN) 500 MG tablet Take 1-2 tablets (500-1,000 mg total) by mouth every 6 (six) hours as needed for muscle spasms. Patient not taking: Reported on 03/14/2023 09/25/15   Montez Morita, PA-C  oxyCODONE (OXY IR/ROXICODONE) 5 MG immediate release tablet Take 1-3 tablets (5-15 mg total) by mouth every 6 (six) hours as needed for breakthrough pain (take between percocet). Patient not taking: Reported on 03/14/2023 09/25/15   Montez Morita, PA-C  oxyCODONE-acetaminophen (ROXICET) 5-325 MG tablet Take 1-2 tablets by mouth every 6 (six) hours as needed for moderate pain or severe pain. Patient not taking: Reported on 03/14/2023 09/25/15   Montez Morita, PA-C  traMADol (ULTRAM) 50 MG tablet Take 50 mg by mouth every 6 (six) hours as needed for severe pain.  Patient not taking: Reported on 03/14/2023 09/13/15   [provider]    Inpatient Medications: Scheduled Meds:  aspirin  324 mg Oral Once   [  START ON 03/15/2023] aspirin EC  81 mg Oral Daily   enoxaparin (LOVENOX) injection  40 mg Subcutaneous Q24H   folic acid  1 mg Oral Daily   LORazepam  0-4 mg Intravenous Q6H   Followed by   Melene Muller ON 03/16/2023] LORazepam  0-4 mg Intravenous Q12H   multivitamin with minerals  1 tablet Oral Daily   nicotine  21 mg Transdermal Daily   thiamine  100 mg Oral Daily   Or   thiamine  100 mg Intravenous Daily   Continuous Infusions:  nitroGLYCERIN 10 mcg/min (03/14/23 1246)   PRN  Meds: hydrALAZINE, ibuprofen, morphine injection, ondansetron (ZOFRAN) IV  Allergies:    Allergies  Allergen Reactions   Acetaminophen Other (See Comments)    At large doses pt gets Gi upset    Social History:   Social History   Socioeconomic History   Marital status: Married    Spouse name: Not on file   Number of children: Not on file   Years of education: Not on file   Highest education level: Not on file  Occupational History   Not on file  Tobacco Use   Smoking status: Every Day    Current packs/day: 1.00    Types: Cigarettes   Smokeless tobacco: Not on file  Substance and Sexual Activity   Alcohol use: Yes    Alcohol/week: 8.0 standard drinks of alcohol    Types: 8 Cans of beer per week   Drug use: Never   Sexual activity: Not on file  Other Topics Concern   Not on file  Social History Narrative   ** Merged History Encounter **       Social Determinants of Health   Financial Resource Strain: Not on file  Food Insecurity: Not on file  Transportation Needs: Not on file  Physical Activity: Not on file  Stress: Not on file  Social Connections: Not on file  Intimate Partner Violence: Not on file    Family History:    Family History  Problem Relation Age of Onset   Heart disease Father      ROS:  Please see the history of present illness.   All other ROS reviewed and negative.     Physical Exam/Data:   Vitals:   03/14/23 1200 03/14/23 1222 03/14/23 1230 03/14/23 1244  BP: (!) 174/111 (!) 166/112 (!) 161/108   Pulse: 70 64 62   Resp: 14 13 16    Temp:      TempSrc:      SpO2: 100% 98% 100%   Weight:    103.7 kg  Height:       No intake or output data in the 24 hours ending 03/14/23 1307    03/14/2023   12:44 PM 03/14/2023    9:36 AM 09/23/2015    2:52 AM  Last 3 Weights  Weight (lbs) 228 lb 9.9 oz 225 lb 220 lb  Weight (kg) 103.7 kg 102.059 kg 99.79 kg     Body mass index is 26.42 kg/m.  General:  Well nourished, well developed, in no  acute distress HEENT: normal Neck: no JVD Vascular: No carotid bruits; Distal pulses 2+ bilaterally Cardiac:  normal S1, S2; RRR; no murmur  Lungs:  clear to auscultation bilaterally, no wheezing, rhonchi or rales  Abd: soft, nontender, no hepatomegaly  Ext: no edema Musculoskeletal:  No deformities, BUE and BLE strength normal and equal Skin: warm and dry  Neuro:  CNs 2-12 intact, no focal abnormalities noted Psych:  Normal affect   EKG:  The EKG was personally reviewed and demonstrates:  NSR with subtle ST dep anterior leads Telemetry:  Telemetry was personally reviewed and demonstrates:  NSR HR 69-70s  Relevant CV Studies:  Echo ordered  Laboratory Data:  High Sensitivity Troponin:   Recent Labs  Lab 03/14/23 0947 03/14/23 1136  TROPONINIHS 112* 519*     Chemistry Recent Labs  Lab 03/14/23 0947  NA 138  K 3.5  CL 107  CO2 22  GLUCOSE 118*  BUN 11  CREATININE 0.88  CALCIUM 8.9  GFRNONAA >60  ANIONGAP 9    No results for input(s): "PROT", "ALBUMIN", "AST", "ALT", "ALKPHOS", "BILITOT" in the last 168 hours. Lipids  Recent Labs  Lab 03/14/23 0947  CHOL 174  TRIG 111  HDL 41  LDLCALC 111*  CHOLHDL 4.2    Hematology Recent Labs  Lab 03/14/23 0947  WBC 7.1  RBC 4.91  HGB 15.3  HCT 44.3  MCV 90.2  MCH 31.2  MCHC 34.5  RDW 12.3  PLT 166   Thyroid No results for input(s): "TSH", "FREET4" in the last 168 hours.  BNPNo results for input(s): "BNP", "PROBNP" in the last 168 hours.  DDimer No results for input(s): "DDIMER" in the last 168 hours.   Radiology/Studies:  CT Angio Chest Aorta W and/or Wo Contrast  Result Date: 03/14/2023 CLINICAL DATA:  Acute aortic syndrome (AAS) suspected. Chest pain radiating to the shoulders today. EXAM: CT ANGIOGRAPHY CHEST WITH CONTRAST TECHNIQUE: Multidetector CT imaging of the chest was performed using the standard protocol during bolus administration of intravenous contrast. Multiplanar CT image reconstructions and  MIPs were obtained to evaluate the vascular anatomy. RADIATION DOSE REDUCTION: This exam was performed according to the departmental dose-optimization program which includes automated exposure control, adjustment of the mA and/or kV according to patient size and/or use of iterative reconstruction technique. CONTRAST:  75mL OMNIPAQUE IOHEXOL 350 MG/ML SOLN COMPARISON:  Chest radiographs 03/14/2023 FINDINGS: Cardiovascular: Mild thoracic aortic atherosclerosis. No aortic intramural hemotoma. Preferential opacification of the thoracic aorta. No evidence of thoracic aortic aneurysm or dissection. Normal heart size. No pericardial effusion. LAD and right coronary artery atherosclerotic calcification. Mediastinum/Nodes: No enlarged axillary, mediastinal, or hilar lymph nodes. Unremarkable thyroid and esophagus. Lungs/Pleura: No pleural effusion or pneumothorax. Small calcified granulomas in the left upper lobe and lingula. Moderate to prominent left hemidiaphragm elevation with ground-glass opacities in the left lower lobe. Mild dependent atelectasis in the right lower lobe. Upper Abdomen: No acute abnormality. Musculoskeletal: No acute osseous abnormality or suspicious osseous lesion. Review of the MIP images confirms the above findings. IMPRESSION: 1. No thoracic aortic aneurysm or dissection. 2. Left hemidiaphragm elevation with ground-glass opacities in the left lower lobe, likely at least partially reflecting atelectasis although a superimposed inflammatory or atypical infectious process is possible. 3. Aortic Atherosclerosis (ICD10-I70.0). Electronically Signed   By: Sebastian Ache M.D.   On: 03/14/2023 12:49   DG Chest 2 View  Result Date: 03/14/2023 CLINICAL DATA:  Chest pain radiating to the shoulders associated with shortness of breath EXAM: CHEST - 2 VIEW COMPARISON:  Chest radiograph dated 09/23/2015 FINDINGS: Normal lung volumes. No focal consolidations. No pleural effusion or pneumothorax. The heart size  and mediastinal contours are within normal limits. No acute osseous abnormality. IMPRESSION: No active cardiopulmonary disease. Electronically Signed   By: Agustin Cree M.D.   On: 03/14/2023 10:56     Assessment and Plan:   NSTEMI - patient presented with chest pain found to have  severely elevated BP and elevated troponin, given ASA 324mg  once and started on IV heparin and IV nitroglycerin for persistent chest pain and elevated BP - HS trop elevated to 500s. EKG with subtle ST changes anterior leads - check echo - started on ASA and statin - BP improving - patient reports active chest pain - plan for LHC, may be today given active chest pain. Will discuss with MD Risks and benefits of cardiac catheterization have been discussed with the patient.  These include bleeding, infection, kidney damage, stroke, heart attack, death.  The patient understands these risks and is willing to proceed.  Hypertensive emergency - long history of untreated HTN, he stopped meds about 6 years ago - Chest CTA negative for dissection - IV nitroglycerin - given losartan, IV hydal and Procardia in the ER - BP improved, but still up - plan to wean nitroglycerin and add antihypertensives as able  Alcohol abuse - quit 03/08/23 - he drank 8-10 beers daily  Tobacco use - he smokes 1/2 ppd - cessation advised  For questions or updates, please contact [facility] Please consult [email or web address] for contact info under    Signed, Lavanda Nevels David Stall, PA-C  03/14/2023 1:07 PM

## 2023-03-14 NOTE — ED Provider Notes (Signed)
[facility] Provider Note   Event Date/Time   First MD Initiated Contact with Patient 03/14/23 [phone]     (approximate) History  Chest Pain  HPI Christian Bates is a 47 y.o. male with a past medical history of alcohol abuse with recent cessation 1 week prior to arrival who presents complaining of bilateral upper chest pain that radiates through the back.  Patient states this pain began this morning and is worse with taking a deep breath.  Patient denies any other exacerbating relieving factors.  Patient states that he has had similar pain in his back before and attributed it to a stressful job working on [employer] ROS: Patient currently denies any vision changes, tinnitus, difficulty speaking, facial droop, sore throat, shortness of breath, abdominal pain, nausea/vomiting/diarrhea, dysuria, or weakness/numbness/paresthesias in any extremity   Physical Exam  Triage Vital Signs: ED Triage Vitals  Encounter Vitals Group     BP 03/14/23 0935 (!) 192/130     Systolic BP Percentile --      Diastolic BP Percentile --      Pulse Rate 03/14/23 0935 82     Resp 03/14/23 0935 20     Temp 03/14/23 0940 98.1 F (36.7 C)     Temp Source 03/14/23 0940 Oral     SpO2 03/14/23 0935 93 %     Weight 03/14/23 0936 225 lb (102.1 kg)     Height 03/14/23 0936 6\' 6"  (1.981 m)     Head Circumference --      Peak Flow --      Pain Score 03/14/23 0934 1     Pain Loc --      Pain Education --      Exclude from Growth Chart --    Most recent vital signs: Vitals:   03/14/23 1508 03/14/23 1531  BP: (!) 115/100   Pulse: 68   Resp: 12   Temp: 97.7 F (36.5 C)   SpO2: 97% 99%   General: Awake, oriented x4. CV:  Good peripheral perfusion.  Resp:  Normal effort.  Abd:  No distention.  Other:  Middle-aged well-developed, well-nourished Caucasian male resting comfortably in no acute distress ED Results / Procedures / Treatments  Labs (all labs ordered  are listed, but only abnormal results are displayed) Labs Reviewed  BASIC METABOLIC PANEL - Abnormal; Notable for the following components:      Result Value   Glucose, Bld 118 (*)    All other components within normal limits  LIPID PANEL - Abnormal; Notable for the following components:   LDL Cholesterol 111 (*)    All other components within normal limits  TROPONIN I (HIGH SENSITIVITY) - Abnormal; Notable for the following components:   Troponin I (High Sensitivity) 112 (*)    All other components within normal limits  TROPONIN I (HIGH SENSITIVITY) - Abnormal; Notable for the following components:   Troponin I (High Sensitivity) 519 (*)    All other components within normal limits  CBC  PROTIME-INR  APTT  HEMOGLOBIN A1C  URINE DRUG SCREEN, QUALITATIVE ([facility] ONLY)  HIV ANTIBODY (ROUTINE TESTING W REFLEX)  ETHANOL  HEPARIN LEVEL (UNFRACTIONATED)  TROPONIN I (HIGH SENSITIVITY)   EKG ED ECG REPORT I, Merwyn Katos, the attending physician, personally viewed and interpreted this ECG. Date: 03/14/2023 EKG Time: 0938 Rate: 82 Rhythm: normal sinus rhythm QRS Axis: normal Intervals: normal ST/T Wave abnormalities: normal Narrative Interpretation: no evidence of acute ischemia RADIOLOGY ED MD  interpretation: CT angiography of the aorta shows no thoracic aortic aneurysm or dissection  2 view chest x-ray interpreted by me shows no evidence of acute abnormalities including no pneumonia, pneumothorax, or widened mediastinum -Agree with radiology assessment Official radiology report(s): CT Angio Chest Aorta W and/or Wo Contrast  Result Date: 03/14/2023 CLINICAL DATA:  Acute aortic syndrome (AAS) suspected. Chest pain radiating to the shoulders today. EXAM: CT ANGIOGRAPHY CHEST WITH CONTRAST TECHNIQUE: Multidetector CT imaging of the chest was performed using the standard protocol during bolus administration of intravenous contrast. Multiplanar CT image reconstructions and MIPs were  obtained to evaluate the vascular anatomy. RADIATION DOSE REDUCTION: This exam was performed according to the departmental dose-optimization program which includes automated exposure control, adjustment of the mA and/or kV according to patient size and/or use of iterative reconstruction technique. CONTRAST:  75mL OMNIPAQUE IOHEXOL 350 MG/ML SOLN COMPARISON:  Chest radiographs 03/14/2023 FINDINGS: Cardiovascular: Mild thoracic aortic atherosclerosis. No aortic intramural hemotoma. Preferential opacification of the thoracic aorta. No evidence of thoracic aortic aneurysm or dissection. Normal heart size. No pericardial effusion. LAD and right coronary artery atherosclerotic calcification. Mediastinum/Nodes: No enlarged axillary, mediastinal, or hilar lymph nodes. Unremarkable thyroid and esophagus. Lungs/Pleura: No pleural effusion or pneumothorax. Small calcified granulomas in the left upper lobe and lingula. Moderate to prominent left hemidiaphragm elevation with ground-glass opacities in the left lower lobe. Mild dependent atelectasis in the right lower lobe. Upper Abdomen: No acute abnormality. Musculoskeletal: No acute osseous abnormality or suspicious osseous lesion. Review of the MIP images confirms the above findings. IMPRESSION: 1. No thoracic aortic aneurysm or dissection. 2. Left hemidiaphragm elevation with ground-glass opacities in the left lower lobe, likely at least partially reflecting atelectasis although a superimposed inflammatory or atypical infectious process is possible. 3. Aortic Atherosclerosis (ICD10-I70.0). Electronically Signed   By: Sebastian Ache M.D.   On: 03/14/2023 12:49   DG Chest 2 View  Result Date: 03/14/2023 CLINICAL DATA:  Chest pain radiating to the shoulders associated with shortness of breath EXAM: CHEST - 2 VIEW COMPARISON:  Chest radiograph dated 09/23/2015 FINDINGS: Normal lung volumes. No focal consolidations. No pleural effusion or pneumothorax. The heart size and  mediastinal contours are within normal limits. No acute osseous abnormality. IMPRESSION: No active cardiopulmonary disease. Electronically Signed   By: Agustin Cree M.D.   On: 03/14/2023 10:56   PROCEDURES: Critical Care performed: Yes, see critical care procedure note(s) .1-3 Lead EKG Interpretation  Performed by: Merwyn Katos, MD Authorized by: Merwyn Katos, MD     Interpretation: normal     ECG rate:  71   ECG rate assessment: normal     Rhythm: sinus rhythm     Ectopy: none     Conduction: normal   CRITICAL CARE Performed by: Merwyn Katos  Total critical care time: 37 minutes  Critical care time was exclusive of separately billable procedures and treating other patients.  Critical care was necessary to treat or prevent imminent or life-threatening deterioration.  Critical care was time spent personally by me on the following activities: development of treatment plan with patient and/or surrogate as well as nursing, discussions with consultants, evaluation of patient's response to treatment, examination of patient, obtaining history from patient or surrogate, ordering and performing treatments and interventions, ordering and review of laboratory studies, ordering and review of radiographic studies, pulse oximetry and re-evaluation of patient's condition.  MEDICATIONS ORDERED IN ED: Medications  morphine (PF) 2 MG/ML injection 2 mg ( Intravenous MAR Hold 03/14/23 1505)  ondansetron (ZOFRAN) injection 4 mg ( Intravenous MAR Hold 03/14/23 1505)  nicotine (NICODERM CQ - dosed in mg/24 hours) patch 21 mg ( Transdermal Automatically Held 03/22/23 1000)  ibuprofen (ADVIL) tablet 200 mg ( Oral MAR Hold 03/14/23 1505)  hydrALAZINE (APRESOLINE) injection 5 mg ( Intravenous MAR Hold 03/14/23 1505)  nitroGLYCERIN 50 mg in dextrose 5 % 250 mL (0.2 mg/mL) infusion ( Intravenous MAR Hold 03/14/23 1505)  thiamine (VITAMIN B1) tablet 100 mg ( Oral Automatically Held 03/22/23 1000)    Or  thiamine  (VITAMIN B1) injection 100 mg ( Intravenous Automatically Held 03/22/23 1000)  folic acid (FOLVITE) tablet 1 mg ( Oral Automatically Held 03/22/23 1000)  multivitamin with minerals tablet 1 tablet ( Oral Automatically Held 03/22/23 1000)  LORazepam (ATIVAN) injection 0-4 mg ( Intravenous Automatically Held 03/16/23 0700)    Followed by  LORazepam (ATIVAN) injection 0-4 mg ( Intravenous Automatically Held 03/18/23 0100)  aspirin EC tablet 81 mg ( Oral Automatically Held 03/23/23 1000)  atorvastatin (LIPITOR) tablet 40 mg ( Oral Automatically Held 03/22/23 1000)  heparin ADULT infusion 100 units/mL (25000 units/231mL) (0 Units/hr Intravenous Hold 03/14/23 1426)  aspirin chewable tablet 81 mg (has no administration in time range)  0.9% sodium chloride infusion (has no administration in time range)    Followed by  0.9% sodium chloride infusion (has no administration in time range)  midazolam (VERSED) injection (1 mg Intravenous Given 03/14/23 1539)  fentaNYL (SUBLIMAZE) injection (25 mcg Intravenous Given 03/14/23 1628)  lidocaine (PF) (XYLOCAINE) 1 % injection (2 mLs  Given 03/14/23 1547)  verapamil (ISOPTIN) injection (2.5 mg Intra-arterial Given 03/14/23 1627)  heparin sodium (porcine) injection (5,000 Units Intravenous Given 03/14/23 1557)  Heparin (Porcine) in NaCl 1000-0.9 UT/500ML-% SOLN (500 mLs  Given 03/14/23 1559)  prasugrel (EFFIENT) tablet (60 mg Oral Given 03/14/23 1601)  nitroGLYCERIN 1 mg/10 mL (100 mcg/mL) - IR/CATH LAB (200 mcg Intracoronary Given 03/14/23 1614)  tirofiban (AGGRASTAT) bolus via infusion (2,592.5 mcg Intravenous Given 03/14/23 1620)  tirofiban (AGGRASTAT) infusion 50 mcg/mL 250 mL (0.15 mcg/kg/min  103.7 kg Intravenous New Bag/Given 03/14/23 1623)  iohexol (OMNIPAQUE) 300 MG/ML solution (105 mLs  Given 03/14/23 1628)  hydrALAZINE (APRESOLINE) injection 10 mg (10 mg Intravenous Given 03/14/23 1039)  NIFEdipine (PROCARDIA-XL/NIFEDICAL-XL) 24 hr tablet 30 mg (30 mg Oral Given 03/14/23  1107)  losartan (COZAAR) tablet 25 mg (25 mg Oral Given 03/14/23 1038)  iohexol (OMNIPAQUE) 350 MG/ML injection 75 mL (75 mLs Intravenous Contrast Given 03/14/23 1114)  aspirin chewable tablet 324 mg (324 mg Oral Given 03/14/23 1341)  heparin bolus via infusion 4,000 Units (4,000 Units Intravenous Bolus from Bag 03/14/23 1354)   IMPRESSION / MDM / ASSESSMENT AND PLAN / ED COURSE  I reviewed the triage vital signs and the nursing notes.                             The patient is on the cardiac monitor to evaluate for evidence of arrhythmia and/or significant heart rate changes. Patient's presentation is most consistent with acute presentation with potential threat to life or bodily function.  This patient presents to the ED for concern of chest pain, this involves an extensive number of treatment options, and is a complaint that carries with it a high risk of complications and morbidity.  The differential diagnosis includes aortic dissection, ACS, hypertensive emergency, press syndrome Co morbidities that complicate the patient evaluation  Hypertension, alcohol abuse Lab Tests:  I Ordered,  and personally interpreted labs.  The pertinent results include: Troponin elevation of 112-->519 Imaging Studies ordered:  I ordered imaging studies including CTA of the chest and aorta that not show any evidence of acute abnormalities  I agree with the radiologist interpretation Cardiac Monitoring: / EKG:  The patient was maintained on a cardiac monitor.  I personally viewed and interpreted the cardiac monitored which showed an underlying rhythm of: Normal sinus rhythm Consultations Obtained:  I requested consultation with the hospitalist,  and discussed lab and imaging findings as well as pertinent plan - they recommend: Admission Problem List / ED Course / Critical interventions / Medication management  Hypertensive emergency  I ordered medication including hydralazine IV, nitroglycerin drip for  hypertension  Reevaluation of the patient after these medicines showed that the patient improved  I have reviewed the patients home medicines and have made adjustments as needed Dispo: Admit to medicine       FINAL CLINICAL IMPRESSION(S) / ED DIAGNOSES   Final diagnoses:  Hypertensive emergency  NSTEMI (non-ST elevated myocardial infarction) (HCC)   Rx / DC Orders   ED Discharge Orders     None      Note:  This document was prepared using Dragon voice recognition software and may include unintentional dictation errors.   Merwyn Katos, MD 03/14/23 [phone]

## 2023-03-14 NOTE — Consult Note (Signed)
Cardiology Consultation   Patient ID: Christian Bates MRN: [id number]; DOB: 11-09-1975  Admit date: 03/14/2023 Date of Consult: 03/14/2023  PCP:  Oneita Hurt, No   [facility] Providers Cardiologist:New   Patient Profile:   Christian Bates is a 47 y.o. male with a hx of alcohol abuse (recently quit 03/08/23), HTN who is being seen 03/14/2023 for the evaluation of NSTEMI at the request of Christian Bates.  History of Present Illness:   Christian Bates has not been seen by cardiology in the past. Patient says he had a diagnosis of HTN, but has not been on medications in about 6 years. He reports he recently quit drinking alcohol. He was drinking heavily for 12 years, in the last 4 years he was 8-10 beer daily. He quit a week ago. He denies drug history. He smokes 1/2 ppd. His father had CABG x5 and a pacemaker.   The patient presented to the ER with chest pain.pain started this morning. It was a pressure that radiated across his shoulders. He had nausea and vomiting. No SOB. Pain was 7/10 and persisted, so he went to the ER. He denies LLE, palpitations, fever or chills.   In the ER BP 192/130, pulse 82, RR 20, afebrile. Labs showed K3.5, Scr 0.88, BG 118, normal CBC. HS troponin 112>519. EKG showed NSR with subtle ST dep anterior leads. CXR negative. CTA chest negative for aneurysm or dissection, possible atelectasis vs inflammatory vs infectious process left lower lobe. Patient was given ASA, , Iv hydralazine, losartan and procardia, and started on IV nitroglycerin and Iv heparin.    Past Medical History:  Diagnosis Date   Alcohol use    Hypertension    Tobacco abuse     Past Surgical History:  Procedure Laterality Date   FEMUR IM NAIL Right 09/24/2015   Procedure: INTRAMEDULLARY (IM) RETROGRADE FEMORAL NAILING;  Surgeon: Myrene Galas, MD;  Location: [facility] OR;  Service: Orthopedics;  Laterality: Right;   LEG SURGERY       Home Medications:  Prior to Admission medications   Medication Sig  Start Date End Date Taking? Authorizing Provider  docusate sodium (COLACE) 100 MG capsule Take 1 capsule (100 mg total) by mouth 2 (two) times daily. 09/25/15  Yes Montez Morita, PA-C  enoxaparin (LOVENOX) 40 MG/0.4ML injection Inject 0.4 mLs (40 mg total) into the skin daily. Patient not taking: Reported on 03/14/2023 09/25/15   Montez Morita, PA-C  lisinopril-hydrochlorothiazide (PRINZIDE,ZESTORETIC) 10-12.5 MG tablet Take 1 tablet by mouth daily. Patient not taking: Reported on 03/14/2023 09/13/15   [provider]  methocarbamol (ROBAXIN) 500 MG tablet Take 1-2 tablets (500-1,000 mg total) by mouth every 6 (six) hours as needed for muscle spasms. Patient not taking: Reported on 03/14/2023 09/25/15   Montez Morita, PA-C  oxyCODONE (OXY IR/ROXICODONE) 5 MG immediate release tablet Take 1-3 tablets (5-15 mg total) by mouth every 6 (six) hours as needed for breakthrough pain (take between percocet). Patient not taking: Reported on 03/14/2023 09/25/15   Montez Morita, PA-C  oxyCODONE-acetaminophen (ROXICET) 5-325 MG tablet Take 1-2 tablets by mouth every 6 (six) hours as needed for moderate pain or severe pain. Patient not taking: Reported on 03/14/2023 09/25/15   Montez Morita, PA-C  traMADol (ULTRAM) 50 MG tablet Take 50 mg by mouth every 6 (six) hours as needed for severe pain.  Patient not taking: Reported on 03/14/2023 09/13/15   [provider]    Inpatient Medications: Scheduled Meds:  aspirin  324 mg Oral Once   [  START ON 03/15/2023] aspirin EC  81 mg Oral Daily   enoxaparin (LOVENOX) injection  40 mg Subcutaneous Q24H   folic acid  1 mg Oral Daily   LORazepam  0-4 mg Intravenous Q6H   Followed by   Melene Muller ON 03/16/2023] LORazepam  0-4 mg Intravenous Q12H   multivitamin with minerals  1 tablet Oral Daily   nicotine  21 mg Transdermal Daily   thiamine  100 mg Oral Daily   Or   thiamine  100 mg Intravenous Daily   Continuous Infusions:  nitroGLYCERIN 10 mcg/min (03/14/23 1246)   PRN  Meds: hydrALAZINE, ibuprofen, morphine injection, ondansetron (ZOFRAN) IV  Allergies:    Allergies  Allergen Reactions   Acetaminophen Other (See Comments)    At large doses pt gets Gi upset    Social History:   Social History   Socioeconomic History   Marital status: Married    Spouse name: Not on file   Number of children: Not on file   Years of education: Not on file   Highest education level: Not on file  Occupational History   Not on file  Tobacco Use   Smoking status: Every Day    Current packs/day: 1.00    Types: Cigarettes   Smokeless tobacco: Not on file  Substance and Sexual Activity   Alcohol use: Yes    Alcohol/week: 8.0 standard drinks of alcohol    Types: 8 Cans of beer per week   Drug use: Never   Sexual activity: Not on file  Other Topics Concern   Not on file  Social History Narrative   ** Merged History Encounter **       Social Determinants of Health   Financial Resource Strain: Not on file  Food Insecurity: Not on file  Transportation Needs: Not on file  Physical Activity: Not on file  Stress: Not on file  Social Connections: Not on file  Intimate Partner Violence: Not on file    Family History:    Family History  Problem Relation Age of Onset   Heart disease Father      ROS:  Please see the history of present illness.   All other ROS reviewed and negative.     Physical Exam/Data:   Vitals:   03/14/23 1200 03/14/23 1222 03/14/23 1230 03/14/23 1244  BP: (!) 174/111 (!) 166/112 (!) 161/108   Pulse: 70 64 62   Resp: 14 13 16    Temp:      TempSrc:      SpO2: 100% 98% 100%   Weight:    103.7 kg  Height:       No intake or output data in the 24 hours ending 03/14/23 1307    03/14/2023   12:44 PM 03/14/2023    9:36 AM 09/23/2015    2:52 AM  Last 3 Weights  Weight (lbs) 228 lb 9.9 oz 225 lb 220 lb  Weight (kg) 103.7 kg 102.059 kg 99.79 kg     Body mass index is 26.42 kg/m.  General:  Well nourished, well developed, in no  acute distress HEENT: normal Neck: no JVD Vascular: No carotid bruits; Distal pulses 2+ bilaterally Cardiac:  normal S1, S2; RRR; no murmur  Lungs:  clear to auscultation bilaterally, no wheezing, rhonchi or rales  Abd: soft, nontender, no hepatomegaly  Ext: no edema Musculoskeletal:  No deformities, BUE and BLE strength normal and equal Skin: warm and dry  Neuro:  CNs 2-12 intact, no focal abnormalities noted Psych:  Normal affect   EKG:  The EKG was personally reviewed and demonstrates:  NSR with subtle ST dep anterior leads Telemetry:  Telemetry was personally reviewed and demonstrates:  NSR HR 69-70s  Relevant CV Studies:  Echo ordered  Laboratory Data:  High Sensitivity Troponin:   Recent Labs  Lab 03/14/23 0947 03/14/23 1136  TROPONINIHS 112* 519*     Chemistry Recent Labs  Lab 03/14/23 0947  NA 138  K 3.5  CL 107  CO2 22  GLUCOSE 118*  BUN 11  CREATININE 0.88  CALCIUM 8.9  GFRNONAA >60  ANIONGAP 9    No results for input(s): "PROT", "ALBUMIN", "AST", "ALT", "ALKPHOS", "BILITOT" in the last 168 hours. Lipids  Recent Labs  Lab 03/14/23 0947  CHOL 174  TRIG 111  HDL 41  LDLCALC 111*  CHOLHDL 4.2    Hematology Recent Labs  Lab 03/14/23 0947  WBC 7.1  RBC 4.91  HGB 15.3  HCT 44.3  MCV 90.2  MCH 31.2  MCHC 34.5  RDW 12.3  PLT 166   Thyroid No results for input(s): "TSH", "FREET4" in the last 168 hours.  BNPNo results for input(s): "BNP", "PROBNP" in the last 168 hours.  DDimer No results for input(s): "DDIMER" in the last 168 hours.   Radiology/Studies:  CT Angio Chest Aorta W and/or Wo Contrast  Result Date: 03/14/2023 CLINICAL DATA:  Acute aortic syndrome (AAS) suspected. Chest pain radiating to the shoulders today. EXAM: CT ANGIOGRAPHY CHEST WITH CONTRAST TECHNIQUE: Multidetector CT imaging of the chest was performed using the standard protocol during bolus administration of intravenous contrast. Multiplanar CT image reconstructions and  MIPs were obtained to evaluate the vascular anatomy. RADIATION DOSE REDUCTION: This exam was performed according to the departmental dose-optimization program which includes automated exposure control, adjustment of the mA and/or kV according to patient size and/or use of iterative reconstruction technique. CONTRAST:  75mL OMNIPAQUE IOHEXOL 350 MG/ML SOLN COMPARISON:  Chest radiographs 03/14/2023 FINDINGS: Cardiovascular: Mild thoracic aortic atherosclerosis. No aortic intramural hemotoma. Preferential opacification of the thoracic aorta. No evidence of thoracic aortic aneurysm or dissection. Normal heart size. No pericardial effusion. LAD and right coronary artery atherosclerotic calcification. Mediastinum/Nodes: No enlarged axillary, mediastinal, or hilar lymph nodes. Unremarkable thyroid and esophagus. Lungs/Pleura: No pleural effusion or pneumothorax. Small calcified granulomas in the left upper lobe and lingula. Moderate to prominent left hemidiaphragm elevation with ground-glass opacities in the left lower lobe. Mild dependent atelectasis in the right lower lobe. Upper Abdomen: No acute abnormality. Musculoskeletal: No acute osseous abnormality or suspicious osseous lesion. Review of the MIP images confirms the above findings. IMPRESSION: 1. No thoracic aortic aneurysm or dissection. 2. Left hemidiaphragm elevation with ground-glass opacities in the left lower lobe, likely at least partially reflecting atelectasis although a superimposed inflammatory or atypical infectious process is possible. 3. Aortic Atherosclerosis (ICD10-I70.0). Electronically Signed   By: Sebastian Ache M.D.   On: 03/14/2023 12:49   DG Chest 2 View  Result Date: 03/14/2023 CLINICAL DATA:  Chest pain radiating to the shoulders associated with shortness of breath EXAM: CHEST - 2 VIEW COMPARISON:  Chest radiograph dated 09/23/2015 FINDINGS: Normal lung volumes. No focal consolidations. No pleural effusion or pneumothorax. The heart size  and mediastinal contours are within normal limits. No acute osseous abnormality. IMPRESSION: No active cardiopulmonary disease. Electronically Signed   By: Agustin Cree M.D.   On: 03/14/2023 10:56     Assessment and Plan:   NSTEMI - patient presented with chest pain found to have  severely elevated BP and elevated troponin, given ASA 324mg  once and started on IV heparin and IV nitroglycerin for persistent chest pain and elevated BP - HS trop elevated to 500s. EKG with subtle ST changes anterior leads - check echo - started on ASA and statin - BP improving - patient reports active chest pain - plan for LHC, may be today given active chest pain. Will discuss with MD Risks and benefits of cardiac catheterization have been discussed with the patient.  These include bleeding, infection, kidney damage, stroke, heart attack, death.  The patient understands these risks and is willing to proceed.  Hypertensive emergency - long history of untreated HTN, he stopped meds about 6 years ago - Chest CTA negative for dissection - IV nitroglycerin - given losartan, IV hydal and Procardia in the ER - BP improved, but still up - plan to wean nitroglycerin and add antihypertensives as able  Alcohol abuse - quit 03/08/23 - he drank 8-10 beers daily  Tobacco use - he smokes 1/2 ppd - cessation advised  For questions or updates, please contact [facility] Please consult [email or web address] for contact info under    Signed, Lavanda Nevels David Stall, PA-C  03/14/2023 1:07 PM

## 2023-03-14 NOTE — ED Notes (Signed)
Pt reporting 7 out of 10 chest pain. BP still elevated despite medication. MD made aware.

## 2023-03-14 NOTE — ED Notes (Signed)
Lab called with critical lab troponin 112. MD notified.

## 2023-03-14 NOTE — Interval H&P Note (Signed)
History and Physical Interval Note:  03/14/2023 3:21 PM  Christian Bates  has presented today for surgery, with the diagnosis of NSTEMI.  The various methods of treatment have been discussed with the patient and family. After consideration of risks, benefits and other options for treatment, the patient has consented to  Procedure(s): LEFT HEART CATH AND CORONARY ANGIOGRAPHY (N/A) as a surgical intervention.  The patient's history has been reviewed, patient examined, no change in status, stable for surgery.  I have reviewed the patient's chart and labs.  Questions were answered to the patient's satisfaction.    Cath Lab Visit (complete for each Cath Lab visit)  Clinical Evaluation Leading to the Procedure:   ACS: Yes.    Non-ACS:  N/A  Advith Martine

## 2023-03-14 NOTE — Plan of Care (Signed)

## 2023-03-14 NOTE — H&P (Addendum)
History and Physical    BERNARD SLAYDEN ION:[id number] DOB: [birth year] DOA: 03/14/2023  Referring MD/NP/PA:   PCP: Pcp, No   Patient coming from:  The patient is coming from home.     Chief Complaint: chest pain  HPI: EMELIO SCHNELLER is a 47 y.o. male with medical history significant of HTN, tobacco abuse, and alcohol use, who presents with chest pain.  Pt state that his chest pain started this morning, which is located in the front chest, constant, pressure-like, moderate, radiating to the back and bilateral shoulders, not aggravated or alleviated by any known factors, not associated with SOB or cough.  His chest pain is not pleuritic.  No fever or chills.  Patient states that he traveled to [facility] by airplane 2 weeks ago.  No tenderness in calf areas.  Patient has nausea and vomited once earlier, which has resolved.  Currently patient does not have nausea, vomiting, diarrhea or abdominal pain.  No symptoms of UTI.  Patient has history of hypertension.  He was on Prinzide in the past.  He stopped taking his medication 6 years ago.  Patient states that he used to drink heavily, almost every day, 8-10 beers each time, but he stopped drinking 1 week ago.  Currently he feels fine.   Patient was found to have elevated blood pressure 192/170 in ED. Pt was given 1 dose of oral nifedipine 30 mg, Cozaar 25 mg and 1 dose of IV hydralazine 10 mg, then nitroglycerin is started. Current Bp is 173/115.   Data reviewed independently and ED Course: pt was found to have trop 112 --> 519, WBC 7.1, GFR> 60, temperature normal, heart rate 82, RR 20, oxygen saturation 100% on room air.  Chest x-ray negative. Pt is placed in PCU for obs  CTA of aorta protocol dissection protocol 1. No thoracic aortic aneurysm or dissection. 2. Left hemidiaphragm elevation with ground-glass opacities in the left lower lobe, likely at least partially reflecting atelectasis although a superimposed inflammatory or  atypical infectious process is possible. 3. Aortic Atherosclerosis (ICD10-I70.0).       EKG: I have personally reviewed.  Sinus rhythm, QTc 436, LAE, early progression  Review of Systems:   General: no fevers, chills, no body weight gain, fatigue HEENT: no blurry vision, hearing changes or sore throat Respiratory: no dyspnea, coughing, wheezing CV: has chest pain, no palpitations GI: had nausea, vomiting, no abdominal pain, diarrhea, constipation GU: no dysuria, burning on urination, increased urinary frequency, hematuria  Ext: no leg edema Neuro: no unilateral weakness, numbness, or tingling, no vision change or hearing loss Skin: no rash, no skin tear. MSK: No muscle spasm, no deformity, no limitation of range of movement in spin Heme: No easy bruising.  Travel history: had recent long distant travel.   Allergy:  Allergies  Allergen Reactions   Acetaminophen Other (See Comments)    At large doses pt gets Gi upset    Past Medical History:  Diagnosis Date   Alcohol use    Hypertension    Tobacco abuse     Past Surgical History:  Procedure Laterality Date   FEMUR IM NAIL Right 09/24/2015   Procedure: INTRAMEDULLARY (IM) RETROGRADE FEMORAL NAILING;  Surgeon: Myrene Galas, MD;  Location: [facility] OR;  Service: Orthopedics;  Laterality: Right;   LEG SURGERY      Social History:  reports that he has been smoking cigarettes. He does not have any smokeless tobacco history on file. He reports current alcohol use of about  8.0 standard drinks of alcohol per week. He reports that he does not use drugs.  Family History:  Family History  Problem Relation Age of Onset   Heart disease Father      Prior to Admission medications   Medication Sig Start Date End Date Taking? Authorizing Provider  docusate sodium (COLACE) 100 MG capsule Take 1 capsule (100 mg total) by mouth 2 (two) times daily. 09/25/15  Yes Montez Morita, PA-C  enoxaparin (LOVENOX) 40 MG/0.4ML injection Inject 0.4 mLs (40  mg total) into the skin daily. Patient not taking: Reported on 03/14/2023 09/25/15   Montez Morita, PA-C  lisinopril-hydrochlorothiazide (PRINZIDE,ZESTORETIC) 10-12.5 MG tablet Take 1 tablet by mouth daily. Patient not taking: Reported on 03/14/2023 09/13/15   [provider]  methocarbamol (ROBAXIN) 500 MG tablet Take 1-2 tablets (500-1,000 mg total) by mouth every 6 (six) hours as needed for muscle spasms. Patient not taking: Reported on 03/14/2023 09/25/15   Montez Morita, PA-C  oxyCODONE (OXY IR/ROXICODONE) 5 MG immediate release tablet Take 1-3 tablets (5-15 mg total) by mouth every 6 (six) hours as needed for breakthrough pain (take between percocet). Patient not taking: Reported on 03/14/2023 09/25/15   Montez Morita, PA-C  oxyCODONE-acetaminophen (ROXICET) 5-325 MG tablet Take 1-2 tablets by mouth every 6 (six) hours as needed for moderate pain or severe pain. Patient not taking: Reported on 03/14/2023 09/25/15   Montez Morita, PA-C  traMADol (ULTRAM) 50 MG tablet Take 50 mg by mouth every 6 (six) hours as needed for severe pain.  Patient not taking: Reported on 03/14/2023 09/13/15   [provider]    Physical Exam: Vitals:   03/14/23 1700 03/14/23 1715 03/14/23 1726 03/14/23 1730  BP: 130/84  (!) 145/94 (!) 137/98  Pulse: 73 63  62  Resp: 16 14  14   Temp:      TempSrc:      SpO2: 97% 98%  99%  Weight:      Height:       General: Not in acute distress HEENT:       Eyes: PERRL, EOMI, no jaundice       ENT: No discharge from the ears and nose, no pharynx injection, no tonsillar enlargement.        Neck: No JVD, no bruit, no mass felt. Heme: No neck lymph node enlargement. Cardiac: S1/S2, RRR, No murmurs, No gallops or rubs. Respiratory: No rales, wheezing, rhonchi or rubs. GI: Soft, nondistended, nontender, no rebound pain, no organomegaly, BS present. GU: No hematuria Ext: No pitting leg edema bilaterally. 1+DP/PT pulse bilaterally. Musculoskeletal: No joint deformities, No  joint redness or warmth, no limitation of ROM in spin. Skin: No rashes.  Neuro: Alert, oriented X3, cranial nerves II-XII grossly intact, moves all extremities normally. Muscle strength 5/5 in all extremities, sensation to light touch intact. Brachial reflex 2+ bilaterally. Knee reflex 1+ bilaterally. Negative Babinski's sign. Normal finger to nose test. Psych: Patient is not psychotic, no suicidal or hemocidal ideation.  Labs on Admission: I have personally reviewed following labs and imaging studies  CBC: Recent Labs  Lab 03/14/23 0947  WBC 7.1  HGB 15.3  HCT 44.3  MCV 90.2  PLT 166   Basic Metabolic Panel: Recent Labs  Lab 03/14/23 0947  NA 138  K 3.5  CL 107  CO2 22  GLUCOSE 118*  BUN 11  CREATININE 0.88  CALCIUM 8.9   GFR: Estimated Creatinine Clearance: 132.7 mL/min (by C-G formula based on SCr of 0.88 mg/dL). Liver Function Tests:  No results for input(s): "AST", "ALT", "ALKPHOS", "BILITOT", "PROT", "ALBUMIN" in the last 168 hours. No results for input(s): "LIPASE", "AMYLASE" in the last 168 hours. No results for input(s): "AMMONIA" in the last 168 hours. Coagulation Profile: Recent Labs  Lab 03/14/23 0947  INR 1.0   Cardiac Enzymes: No results for input(s): "CKTOTAL", "CKMB", "CKMBINDEX", "TROPONINI" in the last 168 hours. BNP (last 3 results) No results for input(s): "PROBNP" in the last 8760 hours. HbA1C: No results for input(s): "HGBA1C" in the last 72 hours. CBG: No results for input(s): "GLUCAP" in the last 168 hours. Lipid Profile: Recent Labs    03/14/23 0947  CHOL 174  HDL 41  LDLCALC 111*  TRIG 111  CHOLHDL 4.2   Thyroid Function Tests: No results for input(s): "TSH", "T4TOTAL", "FREET4", "T3FREE", "THYROIDAB" in the last 72 hours. Anemia Panel: No results for input(s): "VITAMINB12", "FOLATE", "FERRITIN", "TIBC", "IRON", "RETICCTPCT" in the last 72 hours. Urine analysis:    Component Value Date/Time   COLORURINE STRAW (A) 09/23/2015  0012   APPEARANCEUR CLEAR (A) 09/23/2015 0012   LABSPEC 1.003 (L) 09/23/2015 0012   PHURINE 6.0 09/23/2015 0012   GLUCOSEU NEGATIVE 09/23/2015 0012   HGBUR NEGATIVE 09/23/2015 0012   BILIRUBINUR NEGATIVE 09/23/2015 0012   KETONESUR NEGATIVE 09/23/2015 0012   PROTEINUR NEGATIVE 09/23/2015 0012   UROBILINOGEN 0.2 09/19/2009 1354   NITRITE NEGATIVE 09/23/2015 0012   LEUKOCYTESUR NEGATIVE 09/23/2015 0012   Sepsis Labs: @LABRCNTIP (procalcitonin:4,lacticidven:4) )No results found for this or any previous visit (from the past 240 hour(s)).   Radiological Exams on Admission: CARDIAC CATHETERIZATION  Result Date: 03/14/2023 Conclusions: Severe single-vessel coronary artery disease with thrombotic occlusion of proximal ramus intermedius.  There is otherwise, mild to moderate, nonobstructive coronary artery disease involving the LAD and RCA. Low normal left ventricular systolic function (LVEF 50-55%) with mid anterior hypokinesis. Mildly elevated left ventricular filling pressure (LVEDP 15-20 mmHg). Successful PCI to ramus intermedius using Onyx Frontier 2.5 x 15 mm drug-eluting stent (postdilated to 3.1 mm) with 0% residual stenosis and TIMI-3 flow in the main branch.  Small distal sidebranch of ramus intermedius remained occluded with embolized thrombus, too small for intervention. Recommendations: Continue tirofiban infusion for 4 hours. Dual antiplatelet therapy with aspirin and prasugrel for at least 12 months. Secondary prevention with coronary artery disease, including high intensity statin therapy, improved blood pressure control, and tobacco cessation. Follow-up echocardiogram. Yvonne Kendall, MD [facility]  CT Angio Chest Aorta W and/or Wo Contrast  Result Date: 03/14/2023 CLINICAL DATA:  Acute aortic syndrome (AAS) suspected. Chest pain radiating to the shoulders today. EXAM: CT ANGIOGRAPHY CHEST WITH CONTRAST TECHNIQUE: Multidetector CT imaging of the chest was performed using the  standard protocol during bolus administration of intravenous contrast. Multiplanar CT image reconstructions and MIPs were obtained to evaluate the vascular anatomy. RADIATION DOSE REDUCTION: This exam was performed according to the departmental dose-optimization program which includes automated exposure control, adjustment of the mA and/or kV according to patient size and/or use of iterative reconstruction technique. CONTRAST:  75mL OMNIPAQUE IOHEXOL 350 MG/ML SOLN COMPARISON:  Chest radiographs 03/14/2023 FINDINGS: Cardiovascular: Mild thoracic aortic atherosclerosis. No aortic intramural hemotoma. Preferential opacification of the thoracic aorta. No evidence of thoracic aortic aneurysm or dissection. Normal heart size. No pericardial effusion. LAD and right coronary artery atherosclerotic calcification. Mediastinum/Nodes: No enlarged axillary, mediastinal, or hilar lymph nodes. Unremarkable thyroid and esophagus. Lungs/Pleura: No pleural effusion or pneumothorax. Small calcified granulomas in the left upper lobe and lingula. Moderate to prominent left hemidiaphragm elevation  with ground-glass opacities in the left lower lobe. Mild dependent atelectasis in the right lower lobe. Upper Abdomen: No acute abnormality. Musculoskeletal: No acute osseous abnormality or suspicious osseous lesion. Review of the MIP images confirms the above findings. IMPRESSION: 1. No thoracic aortic aneurysm or dissection. 2. Left hemidiaphragm elevation with ground-glass opacities in the left lower lobe, likely at least partially reflecting atelectasis although a superimposed inflammatory or atypical infectious process is possible. 3. Aortic Atherosclerosis (ICD10-I70.0). Electronically Signed   By: Sebastian Ache M.D.   On: 03/14/2023 12:49   DG Chest 2 View  Result Date: 03/14/2023 CLINICAL DATA:  Chest pain radiating to the shoulders associated with shortness of breath EXAM: CHEST - 2 VIEW COMPARISON:  Chest radiograph dated  09/23/2015 FINDINGS: Normal lung volumes. No focal consolidations. No pleural effusion or pneumothorax. The heart size and mediastinal contours are within normal limits. No acute osseous abnormality. IMPRESSION: No active cardiopulmonary disease. Electronically Signed   By: Agustin Cree M.D.   On: 03/14/2023 10:56      Assessment/Plan Principal Problem:   Hypertensive emergency Active Problems:   HTN (hypertension)   NSTEMI (non-ST elevated myocardial infarction) (HCC)   Tobacco abuse   Alcohol use   Assessment and Plan:   Hypertensive emergency and hx of HTN: initial blood pressure was 192/170 in ED. Pt was given 1 dose of oral nifedipine 30 mg, Cozaar 25 mg and 1 dose of IV hydralazine 10 mg, then nitroglycerin is started. Current Bp is 173/115.   -will place in PCU for obs --Continue Nitroglycerin drip for now, the of goal of bp reduction is by about 30% in first several hours, at SBP 130-160 mmHg. -start metoprolol  12.5 mg bid per card - f/u CTA to r/o aortic dissection --> negative for aortic dissection  NSTEMI: trop 112 --> 519. Consulted Dr. Okey Dupre of cardiology --> cardiac cath. -Aspirin 324 mg and then 81 mg daily - start lipitor 40 mg daily  - Risk factor stratification: will check FLP and A1C  - check UDS - prn morphine -IV heparin per card -2d echo per card  Tobacco abuse and Alcohol use: pt states that he stopped drinking alcohol 1 week ago, currently no signs of withdrawal -Did counseling about importance of quitting smoking and alcohol drinking -Nicotine patch -CIWA protocol      DVT ppx: on IV heparin  Code Status: Full code     Family Communication:  Yes, patient's wife    at bed side.     Disposition Plan:  Anticipate discharge back to previous environment  Consults called:  Dr. Okey Dupre of card  Admission status and Level of care: Progressive:   as inpt    Dispo: The patient is from: Home              Anticipated d/c is to: Home               Anticipated d/c date is: 2 days              Patient currently is not medically stable to d/c.    Severity of Illness:  The appropriate patient status for this patient is INPATIENT. Inpatient status is judged to be reasonable and necessary in order to provide the required intensity of service to ensure the patient's safety. The patient's presenting symptoms, physical exam findings, and initial radiographic and laboratory data in the context of their chronic comorbidities is felt to place them at high risk for further clinical deterioration. Furthermore,  it is not anticipated that the patient will be medically stable for discharge from the hospital within 2 midnights of admission.   * I certify that at the point of admission it is my clinical judgment that the patient will require inpatient hospital care spanning beyond 2 midnights from the point of admission due to high intensity of service, high risk for further deterioration and high frequency of surveillance required.*       Date of Service 03/14/2023    Lorretta Harp [facility]   If 7PM-7AM, please contact night-coverage [email or web address] 03/14/2023, 5:33 PM

## 2023-03-15 ENCOUNTER — Inpatient Hospital Stay (HOSPITAL_COMMUNITY)
Admit: 2023-03-15 | Discharge: 2023-03-15 | Disposition: A | Discharge status: Home / Self Care | Payer: BC Managed Care – PPO | Attending: Internal Medicine | Admitting: Internal Medicine

## 2023-03-15 DIAGNOSIS — Z789 Other specified health status: Secondary | ICD-10-CM | POA: Diagnosis not present

## 2023-03-15 DIAGNOSIS — I2583 Coronary atherosclerosis due to lipid rich plaque: Secondary | ICD-10-CM

## 2023-03-15 DIAGNOSIS — E7800 Pure hypercholesterolemia, unspecified: Secondary | ICD-10-CM | POA: Diagnosis not present

## 2023-03-15 DIAGNOSIS — I2510 Atherosclerotic heart disease of native coronary artery without angina pectoris: Secondary | ICD-10-CM | POA: Diagnosis not present

## 2023-03-15 DIAGNOSIS — I161 Hypertensive emergency: Secondary | ICD-10-CM | POA: Diagnosis not present

## 2023-03-15 DIAGNOSIS — R079 Chest pain, unspecified: Secondary | ICD-10-CM | POA: Diagnosis not present

## 2023-03-15 DIAGNOSIS — Z955 Presence of coronary angioplasty implant and graft: Secondary | ICD-10-CM

## 2023-03-15 DIAGNOSIS — I214 Non-ST elevation (NSTEMI) myocardial infarction: Secondary | ICD-10-CM

## 2023-03-15 DIAGNOSIS — I10 Essential (primary) hypertension: Secondary | ICD-10-CM | POA: Diagnosis not present

## 2023-03-15 LAB — CBC
HCT: 41 % (ref 39.0–52.0)
Hemoglobin: 14.5 g/dL (ref 13.0–17.0)
MCH: 31 pg (ref 26.0–34.0)
MCHC: 35.4 g/dL (ref 30.0–36.0)
MCV: 87.8 fL (ref 80.0–100.0)
Platelets: 178 10*3/uL (ref 150–400)
RBC: 4.67 MIL/uL (ref 4.22–5.81)
RDW: 12.4 % (ref 11.5–15.5)
WBC: 7.1 10*3/uL (ref 4.0–10.5)
nRBC: 0 % (ref 0.0–0.2)

## 2023-03-15 LAB — BASIC METABOLIC PANEL
Anion gap: 8 (ref 5–15)
BUN: 11 mg/dL (ref 6–20)
CO2: 22 mmol/L (ref 22–32)
Calcium: 8.7 mg/dL — ABNORMAL LOW (ref 8.9–10.3)
Chloride: 108 mmol/L (ref 98–111)
Creatinine, Ser: 0.82 mg/dL (ref 0.61–1.24)
GFR, Estimated: >60 mL/min (ref >60)
Glucose, Bld: 108 mg/dL — ABNORMAL HIGH (ref 70–99)
Potassium: 3.5 mmol/L (ref 3.5–5.1)
Sodium: 138 mmol/L (ref 135–145)

## 2023-03-15 LAB — ECHOCARDIOGRAM COMPLETE
AV Peak grad: 4.3 mm[Hg]
Ao pk vel: 1.04 m/s
Area-P 1/2: 3.76 cm2
Height: 78 in
S' Lateral: 3 cm
Weight: 3657.87 [oz_av]

## 2023-03-15 LAB — HIV ANTIBODY (ROUTINE TESTING W REFLEX): HIV Screen 4th Generation wRfx: NONREACTIVE

## 2023-03-15 MED ORDER — METOPROLOL SUCCINATE ER 25 MG PO TB24: 25.0000 mg | ORAL_TABLET | Freq: Every day | ORAL | Status: DC

## 2023-03-15 MED ORDER — LOSARTAN POTASSIUM 50 MG PO TABS: 50.0000 mg | ORAL_TABLET | Freq: Every day | ORAL | 0 refills | Status: DC

## 2023-03-15 MED ORDER — NICOTINE 21 MG/24HR TD PT24: 21.0000 mg | MEDICATED_PATCH | Freq: Every day | TRANSDERMAL | 0 refills | Status: DC

## 2023-03-15 MED ORDER — ATORVASTATIN CALCIUM 40 MG PO TABS: 40.0000 mg | ORAL_TABLET | Freq: Every day | ORAL | 0 refills | Status: DC

## 2023-03-15 MED ORDER — PRASUGREL HCL 10 MG PO TABS: 10.0000 mg | ORAL_TABLET | Freq: Every day | ORAL | 0 refills | Status: DC

## 2023-03-15 MED ORDER — LOSARTAN POTASSIUM 50 MG PO TABS
50.0000 mg | ORAL_TABLET | Freq: Every day | ORAL | Status: DC
  Administered 2023-03-15: 50 mg via ORAL
  Filled 2023-03-15: qty 1

## 2023-03-15 MED ORDER — ASPIRIN 81 MG PO TBEC: 81.0000 mg | DELAYED_RELEASE_TABLET | Freq: Every day | ORAL | 12 refills | Status: AC

## 2023-03-15 MED ORDER — METOPROLOL SUCCINATE ER 25 MG PO TB24: 25.0000 mg | ORAL_TABLET | Freq: Every day | ORAL | 0 refills | Status: DC

## 2023-03-15 NOTE — Progress Notes (Signed)
Rounding Note    Patient Name: Christian Bates Date of Encounter: 03/15/2023  [facility] Cardiologist: New  Subjective   Patient is overall feeling much better. No further chest pain or SOB. Cath site is stable. Echo tech in room. BP is much better.   Inpatient Medications    Scheduled Meds:  aspirin EC  81 mg Oral Daily   atorvastatin  40 mg Oral Daily   enoxaparin (LOVENOX) injection  40 mg Subcutaneous Q24H   folic acid  1 mg Oral Daily   LORazepam  0-4 mg Intravenous Q6H   Followed by   Melene Muller ON 03/16/2023] LORazepam  0-4 mg Intravenous Q12H   metoprolol tartrate  12.5 mg Oral BID   multivitamin with minerals  1 tablet Oral Daily   nicotine  21 mg Transdermal Daily   prasugrel  10 mg Oral Daily   sodium chloride flush  3 mL Intravenous Q12H   thiamine  100 mg Oral Daily   Or   thiamine  100 mg Intravenous Daily   Continuous Infusions:  sodium chloride     PRN Meds: sodium chloride, hydrALAZINE, morphine injection, ondansetron (ZOFRAN) IV, sodium chloride flush   Vital Signs    Vitals:   03/15/23 0100 03/15/23 0356 03/15/23 0400 03/15/23 0500  BP: (!) 137/104 (!) 131/100 121/88   Pulse: 80 63 63   Resp:  12 11   Temp:  98.2 F (36.8 C)    TempSrc:  Oral    SpO2:  97% 96%   Weight:    99.1 kg  Height:        Intake/Output Summary (Last 24 hours) at 03/15/2023 0737 Last data filed at 03/15/2023 0411 Gross per 24 hour  Intake 936.49 ml  Output --  Net 936.49 ml      03/15/2023    5:00 AM 03/14/2023    3:08 PM 03/14/2023   12:44 PM  Last 3 Weights  Weight (lbs) 218 lb 9 oz 228 lb 9.9 oz 228 lb 9.9 oz  Weight (kg) 99.14 kg 103.7 kg 103.7 kg      Telemetry    NSR PVCs, HR 70s - Personally Reviewed  ECG    No new - Personally Reviewed  Physical Exam   GEN: No acute distress.   Neck: No JVD Cardiac: RRR, no murmurs, rubs, or gallops.  Respiratory: Clear to auscultation bilaterally. GI: Soft, nontender, non-distended  MS: No  edema; No deformity. Neuro:  Nonfocal  Psych: Normal affect   Labs    High Sensitivity Troponin:   Recent Labs  Lab 03/14/23 0947 03/14/23 1136  TROPONINIHS 112* 519*     Chemistry Recent Labs  Lab 03/14/23 0947 03/15/23 0626  NA 138 138  K 3.5 3.5  CL 107 108  CO2 22 22  GLUCOSE 118* 108*  BUN 11 11  CREATININE 0.88 0.82  CALCIUM 8.9 8.7*  GFRNONAA >60 >60  ANIONGAP 9 8    Lipids  Recent Labs  Lab 03/14/23 0947  CHOL 174  TRIG 111  HDL 41  LDLCALC 111*  CHOLHDL 4.2    Hematology Recent Labs  Lab 03/14/23 0947 03/15/23 0626  WBC 7.1 7.1  RBC 4.91 4.67  HGB 15.3 14.5  HCT 44.3 41.0  MCV 90.2 87.8  MCH 31.2 31.0  MCHC 34.5 35.4  RDW 12.3 12.4  PLT 166 178   Thyroid No results for input(s): "TSH", "FREET4" in the last 168 hours.  BNPNo results for input(s): "BNP", "PROBNP"  in the last 168 hours.  DDimer No results for input(s): "DDIMER" in the last 168 hours.   Radiology    CARDIAC CATHETERIZATION  Result Date: 03/14/2023 Conclusions: Severe single-vessel coronary artery disease with thrombotic occlusion of proximal ramus intermedius.  There is otherwise, mild to moderate, nonobstructive coronary artery disease involving the LAD and RCA. Low normal left ventricular systolic function (LVEF 50-55%) with mid anterior hypokinesis. Mildly elevated left ventricular filling pressure (LVEDP 15-20 mmHg). Successful PCI to ramus intermedius using Onyx Frontier 2.5 x 15 mm drug-eluting stent (postdilated to 3.1 mm) with 0% residual stenosis and TIMI-3 flow in the main branch.  Small distal sidebranch of ramus intermedius remained occluded with embolized thrombus, too small for intervention. Recommendations: Continue tirofiban infusion for 4 hours. Dual antiplatelet therapy with aspirin and prasugrel for at least 12 months. Secondary prevention with coronary artery disease, including high intensity statin therapy, improved blood pressure control, and tobacco cessation.  Follow-up echocardiogram. Yvonne Kendall, MD [facility]  CT Angio Chest Aorta W and/or Wo Contrast  Result Date: 03/14/2023 CLINICAL DATA:  Acute aortic syndrome (AAS) suspected. Chest pain radiating to the shoulders today. EXAM: CT ANGIOGRAPHY CHEST WITH CONTRAST TECHNIQUE: Multidetector CT imaging of the chest was performed using the standard protocol during bolus administration of intravenous contrast. Multiplanar CT image reconstructions and MIPs were obtained to evaluate the vascular anatomy. RADIATION DOSE REDUCTION: This exam was performed according to the departmental dose-optimization program which includes automated exposure control, adjustment of the mA and/or kV according to patient size and/or use of iterative reconstruction technique. CONTRAST:  75mL OMNIPAQUE IOHEXOL 350 MG/ML SOLN COMPARISON:  Chest radiographs 03/14/2023 FINDINGS: Cardiovascular: Mild thoracic aortic atherosclerosis. No aortic intramural hemotoma. Preferential opacification of the thoracic aorta. No evidence of thoracic aortic aneurysm or dissection. Normal heart size. No pericardial effusion. LAD and right coronary artery atherosclerotic calcification. Mediastinum/Nodes: No enlarged axillary, mediastinal, or hilar lymph nodes. Unremarkable thyroid and esophagus. Lungs/Pleura: No pleural effusion or pneumothorax. Small calcified granulomas in the left upper lobe and lingula. Moderate to prominent left hemidiaphragm elevation with ground-glass opacities in the left lower lobe. Mild dependent atelectasis in the right lower lobe. Upper Abdomen: No acute abnormality. Musculoskeletal: No acute osseous abnormality or suspicious osseous lesion. Review of the MIP images confirms the above findings. IMPRESSION: 1. No thoracic aortic aneurysm or dissection. 2. Left hemidiaphragm elevation with ground-glass opacities in the left lower lobe, likely at least partially reflecting atelectasis although a superimposed inflammatory or  atypical infectious process is possible. 3. Aortic Atherosclerosis (ICD10-I70.0). Electronically Signed   By: Sebastian Ache M.D.   On: 03/14/2023 12:49   DG Chest 2 View  Result Date: 03/14/2023 CLINICAL DATA:  Chest pain radiating to the shoulders associated with shortness of breath EXAM: CHEST - 2 VIEW COMPARISON:  Chest radiograph dated 09/23/2015 FINDINGS: Normal lung volumes. No focal consolidations. No pleural effusion or pneumothorax. The heart size and mediastinal contours are within normal limits. No acute osseous abnormality. IMPRESSION: No active cardiopulmonary disease. Electronically Signed   By: Agustin Cree M.D.   On: 03/14/2023 10:56    Cardiac Studies   Cardiac cath 03/14/23 Conclusions: Severe single-vessel coronary artery disease with thrombotic occlusion of proximal ramus intermedius.  There is otherwise, mild to moderate, nonobstructive coronary artery disease involving the LAD and RCA. Low normal left ventricular systolic function (LVEF 50-55%) with mid anterior hypokinesis. Mildly elevated left ventricular filling pressure (LVEDP 15-20 mmHg). Successful PCI to ramus intermedius using Onyx Frontier 2.5 x 15 mm  drug-eluting stent (postdilated to 3.1 mm) with 0% residual stenosis and TIMI-3 flow in the main branch.  Small distal sidebranch of ramus intermedius remained occluded with embolized thrombus, too small for intervention.   Recommendations: Continue tirofiban infusion for 4 hours. Dual antiplatelet therapy with aspirin and prasugrel for at least 12 months. Secondary prevention with coronary artery disease, including high intensity statin therapy, improved blood pressure control, and tobacco cessation. Follow-up echocardiogram.   Yvonne Kendall, MD [facility]  Echo 03/14/23 pending   Patient Profile     47 y.o. male  with a hx of alcohol abuse (recently quit 03/08/23), HTN who is being seen 03/14/2023 for the evaluation of NSTEMI   Assessment & Plan     NSTEMI - patient presented with chest pain found to have severely elevated BP and elevated troponin, given ASA 324mg  once and started on IV heparin and IV nitroglycerin for persistent chest pain and elevated BP - HS trop elevated to 500s. EKG with subtle ST changes anterior leads - heart cath showed severe single vessel CAD with thrombotic occlussion of proximal ramus intermedius treated with PCI x 1. Small distal side branch of ramus intermedius remained occluded, however too small for intervention. Other wise, nonobstructive CAD - echo pending - DPAT with ASA and PRasugrel for at least 12 months - continue Lipitor and lorepssor 12.5mg  BID - cath site stable - need to ambulate patient. Patient is wondering about discharge today.  Hypertensive emergency - long history of untreated HTN, he stopped meds about 6 years ago - Chest CTA negative for dissection - metoprolol 12.5mg  BID - BP wnl   Alcohol abuse - reportedly he quit dirnking 03/08/23 - he drank 8-10 beers daily   Tobacco use - he smokes 1/2 ppd - cessation advised  For questions or updates, please contact [facility] Please consult [email or web address] for contact info under        Signed, Colbie Sliker David Stall, PA-C  03/15/2023, 7:37 AM

## 2023-03-15 NOTE — Discharge Summary (Signed)
Physician Discharge Summary   Patient: Christian Bates MRN: [id number] DOB: 1975-06-20  Admit date:     03/14/2023  Discharge date: 03/15/23  Discharge Physician: Delfino Lovett   PCP: Pcp, No   Recommendations at discharge:   Follow-up with outpatient providers as requested FLP and ALT in 6 weeks with PCP/Cardiology  Discharge Diagnoses: Principal Problem:   Hypertensive emergency Active Problems:   HTN (hypertension)   NSTEMI (non-ST elevated myocardial infarction) (HCC)   Tobacco abuse   Alcohol use  Hospital Course: Assessment and Plan:  47 y.o. male with medical history significant of HTN, tobacco abuse, and alcohol use admitted for non-STEMI in the setting of severe hypertensive urgency  Non-STEMI In the setting of severe hypertensive urgency, peak troponin at 519 Cardiac cath showed acute thrombotic occlusion of the proximal ramus intermedius status post PCI.  Residual 20% in the proximal and distal RCA and minimal luminary irregularities in the LAD. No further chest pain Blood pressure improved Patient being discharged on aspirin, Effient, Toprol, losartan and Lipitor at discharge per cardiology recommendation 2D echo with normal LV function and no valvular disease Follow-up in cardiology office in 7 to 10 days   Alcohol/Tobacco Abuse Counseled       Consultants: Cardio Procedures performed: Cath  Disposition: Home Diet recommendation:  Discharge Diet Orders (From admission, onward)     Start     Ordered   03/15/23 0000  Diet - low sodium heart healthy        03/15/23 1043           Carb modified diet DISCHARGE MEDICATION: Allergies as of 03/15/2023       Reactions   Acetaminophen Other (See Comments)   At large doses pt gets Gi upset        Medication List     STOP taking these medications    enoxaparin 40 MG/0.4ML injection Commonly known as: LOVENOX   lisinopril-hydrochlorothiazide 10-12.5 MG tablet Commonly known as: ZESTORETIC    methocarbamol 500 MG tablet Commonly known as: ROBAXIN   oxyCODONE 5 MG immediate release tablet Commonly known as: Oxy IR/ROXICODONE   oxyCODONE-acetaminophen 5-325 MG tablet Commonly known as: Roxicet   traMADol 50 MG tablet Commonly known as: ULTRAM       TAKE these medications    aspirin EC 81 MG tablet Take 1 tablet (81 mg total) by mouth daily. Swallow whole. Start taking on: March 16, 2023   atorvastatin 40 MG tablet Commonly known as: LIPITOR Take 1 tablet (40 mg total) by mouth daily. Start taking on: March 16, 2023   docusate sodium 100 MG capsule Commonly known as: COLACE Take 1 capsule (100 mg total) by mouth 2 (two) times daily.   losartan 50 MG tablet Commonly known as: COZAAR Take 1 tablet (50 mg total) by mouth daily.   metoprolol succinate 25 MG 24 hr tablet Commonly known as: TOPROL-XL Take 1 tablet (25 mg total) by mouth daily. Start taking on: March 16, 2023   nicotine 21 mg/24hr patch Commonly known as: NICODERM CQ - dosed in mg/24 hours Place 1 patch (21 mg total) onto the skin daily. Start taking on: March 16, 2023   prasugrel 10 MG Tabs tablet Commonly known as: EFFIENT Take 1 tablet (10 mg total) by mouth daily. Start taking on: March 16, 2023        Follow-up Information     End, Cristal Deer, MD. Schedule an appointment as soon as possible for a visit in 1 week(s).  Specialty: Cardiology Why: [facility] Discharge F/UP Contact information: [address] [place] [address] [phone]                Discharge Exam: Ceasar Mons Weights   03/14/23 1244 03/14/23 1508 03/15/23 0500  Weight: 103.7 kg 103.7 kg 99.1 kg   GEN: Well nourished, well developed in no acute distress HEENT: Normal NECK: No JVD; No carotid bruits LYMPHATICS: No lymphadenopathy CARDIAC:RRR, no murmurs, rubs, gallops RESPIRATORY:  Clear to auscultation without rales, wheezing or rhonchi  ABDOMEN: Soft,  non-tender, non-distended MUSCULOSKELETAL:  No edema; No deformity  SKIN: Warm and dry NEUROLOGIC:  Alert and oriented x 3 PSYCHIATRIC:  Normal affect   Condition at discharge: good  The results of significant diagnostics from this hospitalization (including imaging, microbiology, ancillary and laboratory) are listed below for reference.   Imaging Studies: ECHOCARDIOGRAM COMPLETE  Result Date: 03/15/2023    ECHOCARDIOGRAM REPORT   Patient Name:   Christian Bates Date of Exam: 03/15/2023 Medical Rec #:  [id number]        Height:       78.0 in Accession #:    [id number]       Weight:       218.6 lb Date of Birth:  [birth year]        BSA:          2.344 m Patient Age:    47 years         BP:           121/88 mmHg Patient Gender: M                HR:           75 bpm. Exam Location:  [facility] Procedure: 2D Echo Indications:     Chest Pain R07.9  History:         Patient has no prior history of Echocardiogram examinations.  Sonographer:     Overton Mam RDCS, FASE Referring Phys:  [id number] CHRISTOPHER END Diagnosing Phys: Armanda Magic MD  Sonographer Comments: Technically difficult study due to poor echo windows and suboptimal parasternal window. Image acquisition challenging due to patient body habitus. IMPRESSIONS  1. Left ventricular ejection fraction, by estimation, is 60 to 65%. The left ventricle has normal function. The left ventricle has no regional wall motion abnormalities. There is mild left ventricular hypertrophy of the basal-septal segment. Left ventricular diastolic parameters were normal.  2. Right ventricular systolic function is normal. The right ventricular size is normal. Tricuspid regurgitation signal is inadequate for assessing PA pressure.  3. The mitral valve is normal in structure. No evidence of mitral valve regurgitation. No evidence of mitral stenosis.  4. The aortic valve is normal in structure. Aortic valve regurgitation is not visualized. No aortic stenosis is present. FINDINGS  Left  Ventricle: Left ventricular ejection fraction, by estimation, is 60 to 65%. The left ventricle has normal function. The left ventricle has no regional wall motion abnormalities. The left ventricular internal cavity size was normal in size. There is  mild left ventricular hypertrophy of the basal-septal segment. Left ventricular diastolic parameters were normal. Normal left ventricular filling pressure. Right Ventricle: The right ventricular size is normal. No increase in right ventricular wall thickness. Right ventricular systolic function is normal. Tricuspid regurgitation signal is inadequate for assessing PA pressure. Left Atrium: Left atrial size was normal in size. Right Atrium: Right atrial size was normal in size. Pericardium: There is no evidence of pericardial effusion.  Mitral Valve: The mitral valve is normal in structure. No evidence of mitral valve regurgitation. No evidence of mitral valve stenosis. Tricuspid Valve: The tricuspid valve is normal in structure. Tricuspid valve regurgitation is not demonstrated. No evidence of tricuspid stenosis. Aortic Valve: The aortic valve is normal in structure. Aortic valve regurgitation is not visualized. No aortic stenosis is present. Aortic valve peak gradient measures 4.3 mmHg. Pulmonic Valve: The pulmonic valve was normal in structure. Pulmonic valve regurgitation is not visualized. No evidence of pulmonic stenosis. Aorta: The aortic root is normal in size and structure. Venous: The inferior vena cava was not well visualized. IAS/Shunts: No atrial level shunt detected by color flow Doppler.  LEFT VENTRICLE PLAX 2D LVIDd:         4.40 cm Diastology LVIDs:         3.00 cm LV e' medial:    8.27 cm/s LV PW:         1.10 cm LV E/e' medial:  9.4 LV IVS:        1.30 cm LV e' lateral:   8.16 cm/s                        LV E/e' lateral: 9.6  RIGHT VENTRICLE RV Basal diam:  3.60 cm RV S prime:     14.00 cm/s TAPSE (M-mode): 2.0 cm LEFT ATRIUM             Index       RIGHT  ATRIUM           Index LA diam:        4.10 cm 1.75 cm/m  RA Area:     12.10 cm LA Vol (A2C):   17.5 ml 7.46 ml/m  RA Volume:   25.90 ml  11.05 ml/m LA Vol (A4C):   19.0 ml 8.10 ml/m LA Biplane Vol: 18.7 ml 7.98 ml/m  AORTIC VALVE AV Vmax:      104.00 cm/s AV Peak Grad: 4.3 mmHg LVOT Vmax:    85.40 cm/s LVOT Vmean:   57.200 cm/s LVOT VTI:     0.177 m  AORTA Ao Root diam: 3.50 cm MITRAL VALVE MV Area (PHT): 3.76 cm    SHUNTS MV Decel Time: 202 msec    Systemic VTI: 0.18 m MV E velocity: 78.10 cm/s MV A velocity: 61.80 cm/s MV E/A ratio:  1.26 Armanda Magic MD Electronically signed by Armanda Magic MD Signature Date/Time: 03/15/2023/10:16:36 AM    Final    CARDIAC CATHETERIZATION  Result Date: 03/14/2023 Conclusions: Severe single-vessel coronary artery disease with thrombotic occlusion of proximal ramus intermedius.  There is otherwise, mild to moderate, nonobstructive coronary artery disease involving the LAD and RCA. Low normal left ventricular systolic function (LVEF 50-55%) with mid anterior hypokinesis. Mildly elevated left ventricular filling pressure (LVEDP 15-20 mmHg). Successful PCI to ramus intermedius using Onyx Frontier 2.5 x 15 mm drug-eluting stent (postdilated to 3.1 mm) with 0% residual stenosis and TIMI-3 flow in the main branch.  Small distal sidebranch of ramus intermedius remained occluded with embolized thrombus, too small for intervention. Recommendations: Continue tirofiban infusion for 4 hours. Dual antiplatelet therapy with aspirin and prasugrel for at least 12 months. Secondary prevention with coronary artery disease, including high intensity statin therapy, improved blood pressure control, and tobacco cessation. Follow-up echocardiogram. Yvonne Kendall, MD [facility]  CT Angio Chest Aorta W and/or Wo Contrast  Result Date: 03/14/2023 CLINICAL DATA:  Acute aortic syndrome (AAS) suspected. Chest pain  radiating to the shoulders today. EXAM: CT ANGIOGRAPHY CHEST WITH CONTRAST  TECHNIQUE: Multidetector CT imaging of the chest was performed using the standard protocol during bolus administration of intravenous contrast. Multiplanar CT image reconstructions and MIPs were obtained to evaluate the vascular anatomy. RADIATION DOSE REDUCTION: This exam was performed according to the departmental dose-optimization program which includes automated exposure control, adjustment of the mA and/or kV according to patient size and/or use of iterative reconstruction technique. CONTRAST:  75mL OMNIPAQUE IOHEXOL 350 MG/ML SOLN COMPARISON:  Chest radiographs 03/14/2023 FINDINGS: Cardiovascular: Mild thoracic aortic atherosclerosis. No aortic intramural hemotoma. Preferential opacification of the thoracic aorta. No evidence of thoracic aortic aneurysm or dissection. Normal heart size. No pericardial effusion. LAD and right coronary artery atherosclerotic calcification. Mediastinum/Nodes: No enlarged axillary, mediastinal, or hilar lymph nodes. Unremarkable thyroid and esophagus. Lungs/Pleura: No pleural effusion or pneumothorax. Small calcified granulomas in the left upper lobe and lingula. Moderate to prominent left hemidiaphragm elevation with ground-glass opacities in the left lower lobe. Mild dependent atelectasis in the right lower lobe. Upper Abdomen: No acute abnormality. Musculoskeletal: No acute osseous abnormality or suspicious osseous lesion. Review of the MIP images confirms the above findings. IMPRESSION: 1. No thoracic aortic aneurysm or dissection. 2. Left hemidiaphragm elevation with ground-glass opacities in the left lower lobe, likely at least partially reflecting atelectasis although a superimposed inflammatory or atypical infectious process is possible. 3. Aortic Atherosclerosis (ICD10-I70.0). Electronically Signed   By: Sebastian Ache M.D.   On: 03/14/2023 12:49   DG Chest 2 View  Result Date: 03/14/2023 CLINICAL DATA:  Chest pain radiating to the shoulders associated with shortness of  breath EXAM: CHEST - 2 VIEW COMPARISON:  Chest radiograph dated 09/23/2015 FINDINGS: Normal lung volumes. No focal consolidations. No pleural effusion or pneumothorax. The heart size and mediastinal contours are within normal limits. No acute osseous abnormality. IMPRESSION: No active cardiopulmonary disease. Electronically Signed   By: Agustin Cree M.D.   On: 03/14/2023 10:56    Microbiology: Results for orders placed or performed in visit on 06/23/20  Novel Coronavirus, NAA ([facility])     Status: None   Collection Time: 06/23/20  3:46 PM   Specimen: Nasopharyngeal(NP) swabs in vial transport medium   Nasopharynge  Screenin  Result Value Ref Range Status   SARS-CoV-2, NAA Not Detected Not Detected Final    Comment: This nucleic acid amplification test was developed and its performance characteristics determined by [employer]. Nucleic acid amplification tests include RT-PCR and TMA. This test has not been FDA cleared or approved. This test has been authorized by FDA under an Emergency Use Authorization (EUA). This test is only authorized for the duration of time the declaration that circumstances exist justifying the authorization of the emergency use of in vitro diagnostic tests for detection of SARS-CoV-2 virus and/or diagnosis of COVID-19 infection under section 564(b)(1) of the Act, 21 U.S.C. 829FAO-1(H) (1), unless the authorization is terminated or revoked sooner. When diagnostic testing is negative, the possibility of a false negative result should be considered in the context of a patient's recent exposures and the presence of clinical signs and symptoms consistent with COVID-19. An individual without symptoms of COVID-19 and who is not shedding SARS-CoV-2 virus wo uld expect to have a negative (not detected) result in this assay.     Labs: CBC: Recent Labs  Lab 03/14/23 0947 03/15/23 0626  WBC 7.1 7.1  HGB 15.3 14.5  HCT 44.3 41.0  MCV 90.2 87.8  PLT 166 178  Basic Metabolic Panel: Recent Labs  Lab 03/14/23 0947 03/15/23 0626  NA 138 138  K 3.5 3.5  CL 107 108  CO2 22 22  GLUCOSE 118* 108*  BUN 11 11  CREATININE 0.88 0.82  CALCIUM 8.9 8.7*   Discharge time spent: greater than 30 minutes.  Signed: Delfino Lovett, MD [facility] 03/15/2023

## 2023-03-15 NOTE — Progress Notes (Signed)
  Echocardiogram 2D Echocardiogram has been performed.  Christian Bates 03/15/2023, 10:02 AM

## 2023-03-16 ENCOUNTER — Encounter: Payer: Self-pay | Admitting: Internal Medicine

## 2023-03-18 LAB — LIPOPROTEIN A (LPA): Lipoprotein (a): <8.4 nmol/L (ref <75.0)

## 2023-04-07 ENCOUNTER — Encounter: Payer: Self-pay | Admitting: Medical

## 2023-04-07 ENCOUNTER — Ambulatory Visit: Payer: BC Managed Care – PPO | Attending: Medical | Admitting: Medical

## 2023-04-07 VITALS — BP 128/86 | HR 67 | Ht 78.0 in | Wt 218.0 lb

## 2023-04-07 DIAGNOSIS — I214 Non-ST elevation (NSTEMI) myocardial infarction: Secondary | ICD-10-CM

## 2023-04-07 DIAGNOSIS — I2510 Atherosclerotic heart disease of native coronary artery without angina pectoris: Secondary | ICD-10-CM | POA: Diagnosis not present

## 2023-04-07 DIAGNOSIS — Z789 Other specified health status: Secondary | ICD-10-CM

## 2023-04-07 DIAGNOSIS — I10 Essential (primary) hypertension: Secondary | ICD-10-CM

## 2023-04-07 DIAGNOSIS — Z720 Tobacco use: Secondary | ICD-10-CM

## 2023-04-07 DIAGNOSIS — Z79899 Other long term (current) drug therapy: Secondary | ICD-10-CM

## 2023-04-07 DIAGNOSIS — E782 Mixed hyperlipidemia: Secondary | ICD-10-CM

## 2023-04-07 MED ORDER — LOSARTAN POTASSIUM 50 MG PO TABS: 50.0000 mg | ORAL_TABLET | Freq: Every day | ORAL | 3 refills | Status: DC

## 2023-04-07 MED ORDER — PRASUGREL HCL 10 MG PO TABS: 10.0000 mg | ORAL_TABLET | Freq: Every day | ORAL | 3 refills | Status: DC

## 2023-04-07 MED ORDER — METOPROLOL SUCCINATE ER 25 MG PO TB24: 25.0000 mg | ORAL_TABLET | Freq: Every day | ORAL | 3 refills | Status: DC

## 2023-04-07 MED ORDER — ATORVASTATIN CALCIUM 40 MG PO TABS: 40.0000 mg | ORAL_TABLET | Freq: Every day | ORAL | 3 refills | Status: DC

## 2023-04-07 NOTE — Progress Notes (Addendum)
Cardiology Office Note:    Date:  04/07/2023   ID:  [name], DOB 15-Dec-1975, MRN [id number]  PCP:  Pcp, No  [facility] Cardiologist:  None  [facility] Electrophysiologist:  None   Referring MD: No ref. provider found   Chief Complaint: Hospital follow-up  History of Present Illness:    Christian Bates is a 47 y.o. male with a hx of alcohol abuse, hypertension, CAD, HLD, tobacco abuse who presents for hospital follow-up.  She was admitted in late September 2024 with non-STEMI.  He presented with chest pain found to have severely elevated blood pressure and elevated troponin to the 500s.  He reported stopping blood pressure medication 6 years prior.  He reported alcohol use of 8-10 beers daily.  Chest CT negative for dissection.  Heart cath showed severe single-vessel CAD with thrombotic occlusion of the proximal ramus intermedius treated with PCI x 1, small distal sidebranch of ramus intermedius remained occluded and too small for intervention.  Otherwise, nonobstructive CAD.  Patient was started on DAPT with aspirin and prasugrel for at least 12 months.  Echo showed EF 60 to 65%, mild LVH.  Patient was discharged on aspirin, Effient, Toprol, losartan and Lipitor.  Today, the patient reports he is overall doing well. He has made diet changes and is trying to eat better. He has been traveling for work. He has stopped drinking. He is trying to wean off smoking.  Patient took 1 week off before going back to work.  At work, patient uses his hands a lot. He has been lifting things 70-80lbs. Cath site overall appears stable. He denies chest pain, SOB, LLE, orthopnea, or pnd.   Past Medical History:  Diagnosis Date   Alcohol use    Hypertension    Tobacco abuse     Past Surgical History:  Procedure Laterality Date   CORONARY STENT INTERVENTION N/A 03/14/2023   Procedure: CORONARY STENT INTERVENTION;  Surgeon: Yvonne Kendall, MD;  Location: [facility] INVASIVE CV LAB;  Service:  Cardiovascular;  Laterality: N/A;   FEMUR IM NAIL Right 09/24/2015   Procedure: INTRAMEDULLARY (IM) RETROGRADE FEMORAL NAILING;  Surgeon: Myrene Galas, MD;  Location: [facility] OR;  Service: Orthopedics;  Laterality: Right;   LEFT HEART CATH AND CORONARY ANGIOGRAPHY N/A 03/14/2023   Procedure: LEFT HEART CATH AND CORONARY ANGIOGRAPHY;  Surgeon: Yvonne Kendall, MD;  Location: [facility] INVASIVE CV LAB;  Service: Cardiovascular;  Laterality: N/A;   LEG SURGERY      Current Medications: Current Meds  Medication Sig   aspirin EC 81 MG tablet Take 1 tablet (81 mg total) by mouth daily. Swallow whole.   atorvastatin (LIPITOR) 40 MG tablet Take 1 tablet (40 mg total) by mouth daily.   losartan (COZAAR) 50 MG tablet Take 1 tablet (50 mg total) by mouth daily.   metoprolol succinate (TOPROL-XL) 25 MG 24 hr tablet Take 1 tablet (25 mg total) by mouth daily.   prasugrel (EFFIENT) 10 MG TABS tablet Take 1 tablet (10 mg total) by mouth daily.     Allergies:   Acetaminophen   Social History   Socioeconomic History   Marital status: Married    Spouse name: Not on file   Number of children: Not on file   Years of education: Not on file   Highest education level: Not on file  Occupational History   Not on file  Tobacco Use   Smoking status: Every Day    Current packs/day: 1.00    Types: Cigarettes  Smokeless tobacco: Not on file  Substance and Sexual Activity   Alcohol use: Yes    Alcohol/week: 8.0 standard drinks of alcohol    Types: 8 Cans of beer per week   Drug use: Never   Sexual activity: Not on file  Other Topics Concern   Not on file  Social History Narrative   ** Merged History Encounter **       Social Determinants of Health   Financial Resource Strain: Not on file  Food Insecurity: No Food Insecurity (03/14/2023)   Hunger Vital Sign    Worried About Running Out of Food in the Last Year: Never true    Ran Out of Food in the Last Year: Never true  Transportation Needs: No  Transportation Needs (03/14/2023)   PRAPARE - Administrator, Civil Service (Medical): No    Lack of Transportation (Non-Medical): No  Physical Activity: Not on file  Stress: Not on file  Social Connections: Not on file     Family History: The patient's family history includes Heart disease in his father.  ROS:   Please see the history of present illness.     All other systems reviewed and are negative.  EKGs/Labs/Other Studies Reviewed:    The following studies were reviewed today:  Cardiac cath 03/14/23 Conclusions: Severe single-vessel coronary artery disease with thrombotic occlusion of proximal ramus intermedius.  There is otherwise, mild to moderate, nonobstructive coronary artery disease involving the LAD and RCA. Low normal left ventricular systolic function (LVEF 50-55%) with mid anterior hypokinesis. Mildly elevated left ventricular filling pressure (LVEDP 15-20 mmHg). Successful PCI to ramus intermedius using Onyx Frontier 2.5 x 15 mm drug-eluting stent (postdilated to 3.1 mm) with 0% residual stenosis and TIMI-3 flow in the main branch.  Small distal sidebranch of ramus intermedius remained occluded with embolized thrombus, too small for intervention.   Recommendations: Continue tirofiban infusion for 4 hours. Dual antiplatelet therapy with aspirin and prasugrel for at least 12 months. Secondary prevention with coronary artery disease, including high intensity statin therapy, improved blood pressure control, and tobacco cessation. Follow-up echocardiogram.   Yvonne Kendall, MD [facility]   Echo 03/15/23 1. Left ventricular ejection fraction, by estimation, is 60 to 65%. The  left ventricle has normal function. The left ventricle has no regional  wall motion abnormalities. There is mild left ventricular hypertrophy of  the basal-septal segment. Left  ventricular diastolic parameters were normal.   2. Right ventricular systolic function is normal. The  right ventricular  size is normal. Tricuspid regurgitation signal is inadequate for assessing  PA pressure.   3. The mitral valve is normal in structure. No evidence of mitral valve  regurgitation. No evidence of mitral stenosis.   4. The aortic valve is normal in structure. Aortic valve regurgitation is  not visualized. No aortic stenosis is present.    EKG:  EKG is ordered today.  The ekg ordered today demonstrates NSR 67bpm, TWI aVL  Recent Labs: 03/15/2023: BUN 11; Creatinine, Ser 0.82; Hemoglobin 14.5; Platelets 178; Potassium 3.5; Sodium 138  Recent Lipid Panel    Component Value Date/Time   CHOL 174 03/14/2023 0947   TRIG 111 03/14/2023 0947   HDL 41 03/14/2023 0947   CHOLHDL 4.2 03/14/2023 0947   VLDL 22 03/14/2023 0947   LDLCALC 111 (H) 03/14/2023 0947    Physical Exam:    VS:  BP 128/86 (BP Location: Left Arm, Patient Position: Sitting, Cuff Size: Normal)   Pulse 67  Ht 6\' 6"  (1.981 m)   Wt 218 lb (98.9 kg)   SpO2 99%   BMI 25.19 kg/m     Wt Readings from Last 3 Encounters:  04/07/23 218 lb (98.9 kg)  03/15/23 218 lb 9 oz (99.1 kg)  09/23/15 220 lb (99.8 kg)     GEN:  Well nourished, well developed in no acute distress HEENT: Normal NECK: No JVD; No carotid bruits LYMPHATICS: No lymphadenopathy CARDIAC: RRR, no murmurs, rubs, gallops RESPIRATORY:  Clear to auscultation without rales, wheezing or rhonchi  ABDOMEN: Soft, non-tender, non-distended MUSCULOSKELETAL:  No edema; No deformity  SKIN: Warm and dry NEUROLOGIC:  Alert and oriented x 3 PSYCHIATRIC:  Normal affect   ASSESSMENT:    1. NSTEMI (non-ST elevated myocardial infarction) (HCC)   2. Coronary artery disease involving native coronary artery of native heart without angina pectoris   3. Medication management   4. Essential hypertension   5. Hyperlipidemia, mixed   6. Alcohol use   7. Tobacco abuse    PLAN:    In order of problems listed above:  NSTEMI Recent admission for non-STEMI.   Cardiac cath showed acute thrombotic occlusion of the proximal ramus intermedius status post PCI.  There was residual 20% in the proximal and distal RCA and minimal luminary irregularities in the LAD.   Patient was started on DAPT with Effient and aspirin for 12 months. Echo showed normal LVEF and no valvular disease. Post-discharge  the patient took 1 week off before going back to work.  Patient works with his hands and has been lifting heavy objects.  Patient denies any issues with his cath site, this appears overall stable.  Patient denies any chest pain or shortness of breath.  Patient has stopped drinking and is trying to wean off smoking.  Lifestyle changes discussed in detail today.  Will continue DAPT with aspirin 81 mg daily and Effient 10 mg daily.  Continue Lipitor 40 mg daily, losartan 50 mg daily and Toprol 25 mg daily.  I will refer him to cardiac rehab.  I will check a CBC and be met today.  HTN BP is good today, continue Losartan and Toprol.  Labs as above.  HLD LDL 111. Continue Lipitor 40mg  daily. We will update labs at follow-up.  Alcohol abuse He has stopped drinking.  Tobacco use He is still smoking, but trying to wean off by the end of this month.   Disposition: Follow up in 1 month(s) with MD/APP    Signed, Azadeh Hyder David Stall, PA-C  04/07/2023 9:11 AM    [facility]

## 2023-04-07 NOTE — Patient Instructions (Signed)
Medication Instructions:  Your physician recommends that you continue on your current medications as directed. Please refer to the Current Medication list given to you today.   *If you need a refill on your cardiac medications before your next appointment, please call your pharmacy*   Lab Work: Your provider would like for you to have following labs drawn today (CBC, BMP).     Testing/Procedures: No test ordered today    Follow-Up: At [facility], you and your health needs are our priority.  As part of our continuing mission to provide you with exceptional heart care, we have created designated Provider Care Teams.  These Care Teams include your primary Cardiologist (physician) and Advanced Practice Providers (APPs -  Physician Assistants and Nurse Practitioners) who all work together to provide you with the care you need, when you need it.  We recommend signing up for the patient portal called "MyChart".  Sign up information is provided on this After Visit Summary.  MyChart is used to connect with patients for Virtual Visits (Telemedicine).  Patients are able to view lab/test results, encounter notes, upcoming appointments, etc.  Non-urgent messages can be sent to your provider as well.   To learn more about what you can do with MyChart, go to [email or web address].    Your next appointment:   1 month(s)  Provider:   Cadence Lorna Few  We have placed a referral for Cardiac Rehab

## 2023-04-07 NOTE — Addendum Note (Signed)
Addended by: Parke Poisson on: 04/07/2023 09:32 AM   Modules accepted: Orders

## 2023-04-08 LAB — BASIC METABOLIC PANEL
BUN/Creatinine Ratio: 14 (ref 9–20)
BUN: 15 mg/dL (ref 6–24)
CO2: 23 mmol/L (ref 20–29)
Calcium: 10.3 mg/dL — ABNORMAL HIGH (ref 8.7–10.2)
Chloride: 104 mmol/L (ref 96–106)
Creatinine, Ser: 1.07 mg/dL (ref 0.76–1.27)
Glucose: 104 mg/dL — ABNORMAL HIGH (ref 70–99)
Potassium: 4.5 mmol/L (ref 3.5–5.2)
Sodium: 141 mmol/L (ref 134–144)
eGFR: 86 mL/min/{1.73_m2} (ref >59)

## 2023-04-08 LAB — CBC
Hematocrit: 46.6 % (ref 37.5–51.0)
Hemoglobin: 15.3 g/dL (ref 13.0–17.7)
MCH: 31 pg (ref 26.6–33.0)
MCHC: 32.8 g/dL (ref 31.5–35.7)
MCV: 94 fL (ref 79–97)
Platelets: 204 10*3/uL (ref 150–450)
RBC: 4.94 x10E6/uL (ref 4.14–5.80)
RDW: 12 % (ref 11.6–15.4)
WBC: 6.1 10*3/uL (ref 3.4–10.8)

## 2023-04-10 NOTE — Plan of Care (Signed)
[facility] Tonsillectomy/Adenoidectomy, Postoperative PEDS care plan entered in error.

## 2023-05-05 ENCOUNTER — Ambulatory Visit: Payer: BC Managed Care – PPO | Admitting: Medical

## 2023-05-05 NOTE — Progress Notes (Deleted)
Cardiology Office Note:    Date:  05/05/2023   ID:  [name], DOB 06-03-1976, MRN [id number]  PCP:  Pcp, No  [facility] Cardiologist:  None  [facility] Electrophysiologist:  None   Referring MD: No ref. provider found   Chief Complaint: ***  History of Present Illness:    Christian Bates is a 47 y.o. male with a hx of  alcohol abuse, hypertension, CAD, HLD, tobacco abuse who presents for hospital follow-up.   She was admitted in late September 2024 with non-STEMI.  He presented with chest pain found to have severely elevated blood pressure and elevated troponin to the 500s.  He reported stopping blood pressure medication 6 years prior.  He reported alcohol use of 8-10 beers daily.  Chest CT negative for dissection.  Heart cath showed severe single-vessel CAD with thrombotic occlusion of the proximal ramus intermedius treated with PCI x 1, small distal sidebranch of ramus intermedius remained occluded and too small for intervention.  Otherwise, nonobstructive CAD.  Patient was started on DAPT with aspirin and prasugrel for at least 12 months.  Echo showed EF 60 to 65%, mild LVH.  Patient was discharged on aspirin, Effient, Toprol, losartan and Lipitor.  The patient was last seen 04/07/23 and was overall doing well from a cardiac perspective. He reported he stopped drinking.  Today,   Past Medical History:  Diagnosis Date   Alcohol use    Hypertension    Tobacco abuse     Past Surgical History:  Procedure Laterality Date   CORONARY STENT INTERVENTION N/A 03/14/2023   Procedure: CORONARY STENT INTERVENTION;  Surgeon: Yvonne Kendall, MD;  Location: [facility] INVASIVE CV LAB;  Service: Cardiovascular;  Laterality: N/A;   FEMUR IM NAIL Right 09/24/2015   Procedure: INTRAMEDULLARY (IM) RETROGRADE FEMORAL NAILING;  Surgeon: Myrene Galas, MD;  Location: [facility];  Service: Orthopedics;  Laterality: Right;   LEFT HEART CATH AND CORONARY ANGIOGRAPHY N/A 03/14/2023   Procedure: LEFT  HEART CATH AND CORONARY ANGIOGRAPHY;  Surgeon: Yvonne Kendall, MD;  Location: [facility] INVASIVE CV LAB;  Service: Cardiovascular;  Laterality: N/A;   LEG SURGERY      Current Medications: No outpatient medications have been marked as taking for the 05/05/23 encounter (Appointment) with Christian Bates, Christian Bates H, PA-C.     Allergies:   Acetaminophen   Social History   Socioeconomic History   Marital status: Married    Spouse name: Not on file   Number of children: Not on file   Years of education: Not on file   Highest education level: Not on file  Occupational History   Not on file  Tobacco Use   Smoking status: Every Day    Current packs/day: 1.00    Types: Cigarettes   Smokeless tobacco: Not on file  Substance and Sexual Activity   Alcohol use: Yes    Alcohol/week: 8.0 standard drinks of alcohol    Types: 8 Cans of beer per week   Drug use: Never   Sexual activity: Not on file  Other Topics Concern   Not on file  Social History Narrative   ** Merged History Encounter **       Social Determinants of Health   Financial Resource Strain: Not on file  Food Insecurity: No Food Insecurity (03/14/2023)   Hunger Vital Sign    Worried About Running Out of Food in the Last Year: Never true    Ran Out of Food in the Last Year: Never true  Transportation  Needs: No Transportation Needs (03/14/2023)   PRAPARE - Administrator, Civil Service (Medical): No    Lack of Transportation (Non-Medical): No  Physical Activity: Not on file  Stress: Not on file  Social Connections: Not on file     Family History: The patient's ***family history includes Heart disease in his father.  ROS:   Please see the history of present illness.    *** All other systems reviewed and are negative.  EKGs/Labs/Other Studies Reviewed:    The following studies were reviewed today: ***  EKG:  EKG is *** ordered today.  The ekg ordered today demonstrates ***  Recent Labs: 04/07/2023: BUN 15;  Creatinine, Ser 1.07; Hemoglobin 15.3; Platelets 204; Potassium 4.5; Sodium 141  Recent Lipid Panel    Component Value Date/Time   CHOL 174 03/14/2023 0947   TRIG 111 03/14/2023 0947   HDL 41 03/14/2023 0947   CHOLHDL 4.2 03/14/2023 0947   VLDL 22 03/14/2023 0947   LDLCALC 111 (H) 03/14/2023 0947     Risk Assessment/Calculations:   {Does this patient have ATRIAL FIBRILLATION?[phone]}   Physical Exam:    VS:  There were no vitals taken for this visit.    Wt Readings from Last 3 Encounters:  04/07/23 218 lb (98.9 kg)  03/15/23 218 lb 9 oz (99.1 kg)  09/23/15 220 lb (99.8 kg)     GEN: *** Well nourished, well developed in no acute distress HEENT: Normal NECK: No JVD; No carotid bruits LYMPHATICS: No lymphadenopathy CARDIAC: ***RRR, no murmurs, rubs, gallops RESPIRATORY:  Clear to auscultation without rales, wheezing or rhonchi  ABDOMEN: Soft, non-tender, non-distended MUSCULOSKELETAL:  No edema; No deformity  SKIN: Warm and dry NEUROLOGIC:  Alert and oriented x 3 PSYCHIATRIC:  Normal affect   ASSESSMENT:    No diagnosis found. PLAN:    In order of problems listed above:  ***  Disposition: Follow up {follow up:15908} with ***   Shared Decision Making/Informed Consent   {Are you ordering a CV Procedure (e.g. stress test, cath, DCCV, TEE, etc)?   Press F2        :[id number]}    Signed, Jorgina Binning Ardelle Lesches  05/05/2023 7:37 AM    [facility]

## 2023-05-14 ENCOUNTER — Encounter: Payer: Self-pay | Admitting: Medical

## 2023-05-14 ENCOUNTER — Ambulatory Visit: Payer: BC Managed Care – PPO | Attending: Medical | Admitting: Medical

## 2023-05-14 VITALS — BP 128/82 | HR 81 | Ht 78.0 in | Wt 219.0 lb

## 2023-05-14 DIAGNOSIS — Z79899 Other long term (current) drug therapy: Secondary | ICD-10-CM | POA: Diagnosis not present

## 2023-05-14 DIAGNOSIS — Z789 Other specified health status: Secondary | ICD-10-CM

## 2023-05-14 DIAGNOSIS — I2510 Atherosclerotic heart disease of native coronary artery without angina pectoris: Secondary | ICD-10-CM | POA: Diagnosis not present

## 2023-05-14 DIAGNOSIS — I10 Essential (primary) hypertension: Secondary | ICD-10-CM

## 2023-05-14 DIAGNOSIS — E782 Mixed hyperlipidemia: Secondary | ICD-10-CM | POA: Diagnosis not present

## 2023-05-14 DIAGNOSIS — Z720 Tobacco use: Secondary | ICD-10-CM

## 2023-05-14 NOTE — Patient Instructions (Signed)
Medication Instructions:  Your Physician recommend you continue on your current medication as directed.    *If you need a refill on your cardiac medications before your next appointment, please call your pharmacy*   Lab Work: Your provider would like for you to have following labs drawn today Lipid panel (fasting).   If you have labs (blood work) drawn today and your tests are completely normal, you will receive your results only by: MyChart Message (if you have MyChart) OR A paper copy in the mail If you have any lab test that is abnormal or we need to change your treatment, we will call you to review the results.   Testing/Procedures: None ordered at this time    Follow-Up: At [facility], you and your health needs are our priority.  As part of our continuing mission to provide you with exceptional heart care, we have created designated Provider Care Teams.  These Care Teams include your primary Cardiologist (physician) and Advanced Practice Providers (APPs -  Physician Assistants and Nurse Practitioners) who all work together to provide you with the care you need, when you need it.  We recommend signing up for the patient portal called "MyChart".  Sign up information is provided on this After Visit Summary.  MyChart is used to connect with patients for Virtual Visits (Telemedicine).  Patients are able to view lab/test results, encounter notes, upcoming appointments, etc.  Non-urgent messages can be sent to your provider as well.   To learn more about what you can do with MyChart, go to [email or web address].    Your next appointment:   3 month(s)  Provider:   You will see one of the following Advanced Practice Providers on your designated Care Team:   Nicolasa Ducking, NP Eula Listen, PA-C Cadence Fransico Michael, PA-C Charlsie Quest, NP Carlos Levering, NP

## 2023-05-14 NOTE — Progress Notes (Signed)
Cardiology Office Note:    Date:  05/14/2023   ID:  Christian Bates, DOB 06-16-1976, MRN [id number]  PCP:  Oneita Hurt No  [facility] Cardiologist:  Yvonne Kendall, MD  [facility] Electrophysiologist:  None   Referring MD: No ref. provider found   Chief Complaint: 1 month follow-up  History of Present Illness:    Christian Bates is a 47 y.o. male with a hx of alcohol abuse, hypertension, CAD, HLD, tobacco abuse who presents for hospital follow-up.   She was admitted in late September 2024 with non-STEMI.  He presented with chest pain found to have severely elevated blood pressure and elevated troponin to the 500s.  He reported stopping blood pressure medication 6 years prior.  He reported alcohol use of 8-10 beers daily.  Chest CT negative for dissection.  Heart cath showed severe single-vessel CAD with thrombotic occlusion of the proximal ramus intermedius treated with PCI x 1, small distal sidebranch of ramus intermedius remained occluded and too small for intervention.  Otherwise, nonobstructive CAD.  Patient was started on DAPT with aspirin and prasugrel for at least 12 months.  Echo showed EF 60 to 65%, mild LVH.  Patient was discharged on aspirin, Effient, Toprol, losartan and Lipitor.  The patient was last seen 04/07/23 and was overall doing well from a cardiac perspective. He reported he stopped drinking  Today, patient is overall doing well from a cardiac perspective.  He denies chest pain, shortness of breath, lower leg edema, palpitation, heart racing, orthopnea, PND.  Patient is back at work with his full function.  He is very active at work.  Patient is drinking occasionally on the weekends.  Unfortunately, he is still smoking.  Blood pressure is good today.  Past Medical History:  Diagnosis Date   Alcohol use    Hypertension    Tobacco abuse     Past Surgical History:  Procedure Laterality Date   CORONARY STENT INTERVENTION N/A 03/14/2023   Procedure: CORONARY STENT  INTERVENTION;  Surgeon: Yvonne Kendall, MD;  Location: [facility] INVASIVE CV LAB;  Service: Cardiovascular;  Laterality: N/A;   FEMUR IM NAIL Right 09/24/2015   Procedure: INTRAMEDULLARY (IM) RETROGRADE FEMORAL NAILING;  Surgeon: Myrene Galas, MD;  Location: [facility];  Service: Orthopedics;  Laterality: Right;   LEFT HEART CATH AND CORONARY ANGIOGRAPHY N/A 03/14/2023   Procedure: LEFT HEART CATH AND CORONARY ANGIOGRAPHY;  Surgeon: Yvonne Kendall, MD;  Location: [facility] INVASIVE CV LAB;  Service: Cardiovascular;  Laterality: N/A;   LEG SURGERY      Current Medications: Current Meds  Medication Sig   aspirin EC 81 MG tablet Take 1 tablet (81 mg total) by mouth daily. Swallow whole.   atorvastatin (LIPITOR) 40 MG tablet Take 1 tablet (40 mg total) by mouth daily.   losartan (COZAAR) 50 MG tablet Take 1 tablet (50 mg total) by mouth daily.   metoprolol succinate (TOPROL-XL) 25 MG 24 hr tablet Take 1 tablet (25 mg total) by mouth daily.   prasugrel (EFFIENT) 10 MG TABS tablet Take 1 tablet (10 mg total) by mouth daily.     Allergies:   Acetaminophen   Social History   Socioeconomic History   Marital status: Married    Spouse name: Not on file   Number of children: Not on file   Years of education: Not on file   Highest education level: Not on file  Occupational History   Not on file  Tobacco Use   Smoking status: Every Day  Current packs/day: 1.00    Types: Cigarettes   Smokeless tobacco: Not on file  Substance and Sexual Activity   Alcohol use: Yes    Alcohol/week: 8.0 standard drinks of alcohol    Types: 8 Cans of beer per week   Drug use: Never   Sexual activity: Not on file  Other Topics Concern   Not on file  Social History Narrative   ** Merged History Encounter **       Social Determinants of Health   Financial Resource Strain: Not on file  Food Insecurity: No Food Insecurity (03/14/2023)   Hunger Vital Sign    Worried About Running Out of Food in the Last Year: Never  true    Ran Out of Food in the Last Year: Never true  Transportation Needs: No Transportation Needs (03/14/2023)   PRAPARE - Administrator, Civil Service (Medical): No    Lack of Transportation (Non-Medical): No  Physical Activity: Not on file  Stress: Not on file  Social Connections: Not on file     Family History: The patient's family history includes Heart disease in his father.  ROS:   Please see the history of present illness.     All other systems reviewed and are negative.  EKGs/Labs/Other Studies Reviewed:    The following studies were reviewed today:   Cardiac cath 03/14/23 Conclusions: Severe single-vessel coronary artery disease with thrombotic occlusion of proximal ramus intermedius.  There is otherwise, mild to moderate, nonobstructive coronary artery disease involving the LAD and RCA. Low normal left ventricular systolic function (LVEF 50-55%) with mid anterior hypokinesis. Mildly elevated left ventricular filling pressure (LVEDP 15-20 mmHg). Successful PCI to ramus intermedius using Onyx Frontier 2.5 x 15 mm drug-eluting stent (postdilated to 3.1 mm) with 0% residual stenosis and TIMI-3 flow in the main branch.  Small distal sidebranch of ramus intermedius remained occluded with embolized thrombus, too small for intervention.   Recommendations: Continue tirofiban infusion for 4 hours. Dual antiplatelet therapy with aspirin and prasugrel for at least 12 months. Secondary prevention with coronary artery disease, including high intensity statin therapy, improved blood pressure control, and tobacco cessation. Follow-up echocardiogram.   Yvonne Kendall, MD [facility]   Echo 03/15/23 1. Left ventricular ejection fraction, by estimation, is 60 to 65%. The  left ventricle has normal function. The left ventricle has no regional  wall motion abnormalities. There is mild left ventricular hypertrophy of  the basal-septal segment. Left  ventricular  diastolic parameters were normal.   2. Right ventricular systolic function is normal. The right ventricular  size is normal. Tricuspid regurgitation signal is inadequate for assessing  PA pressure.   3. The mitral valve is normal in structure. No evidence of mitral valve  regurgitation. No evidence of mitral stenosis.   4. The aortic valve is normal in structure. Aortic valve regurgitation is  not visualized. No aortic stenosis is present.     EKG:  EKG is not ordered today.   Recent Labs: 04/07/2023: BUN 15; Creatinine, Ser 1.07; Hemoglobin 15.3; Platelets 204; Potassium 4.5; Sodium 141  Recent Lipid Panel    Component Value Date/Time   CHOL 174 03/14/2023 0947   TRIG 111 03/14/2023 0947   HDL 41 03/14/2023 0947   CHOLHDL 4.2 03/14/2023 0947   VLDL 22 03/14/2023 0947   LDLCALC 111 (H) 03/14/2023 0947    Physical Exam:    VS:  BP 128/82 (BP Location: Left Arm, Patient Position: Sitting, Cuff Size: Normal)  Pulse 81   Ht 6\' 6"  (1.981 m)   Wt 219 lb (99.3 kg)   SpO2 98%   BMI 25.31 kg/m     Wt Readings from Last 3 Encounters:  05/14/23 219 lb (99.3 kg)  04/07/23 218 lb (98.9 kg)  03/15/23 218 lb 9 oz (99.1 kg)     GEN:  Well nourished, well developed in no acute distress HEENT: Normal NECK: No JVD; No carotid bruits LYMPHATICS: No lymphadenopathy CARDIAC: RRR, no murmurs, rubs, gallops RESPIRATORY:  Clear to auscultation without rales, wheezing or rhonchi  ABDOMEN: Soft, non-tender, non-distended MUSCULOSKELETAL:  No edema; No deformity  SKIN: Warm and dry NEUROLOGIC:  Alert and oriented x 3 PSYCHIATRIC:  Normal affect   ASSESSMENT:    1. Coronary artery disease involving native coronary artery of native heart without angina pectoris   2. Medication management   3. Essential hypertension   4. Hyperlipidemia, mixed   5. Alcohol use   6. Tobacco abuse    PLAN:    In order of problems listed above:  CAD s/p PCI/DES proximal ramus intermedius Patient  reports he is overall doing well with no anginal symptoms.  He is back at work and work is very physical.  He denies bleeding with DAPT.  No further ischemic workup at this time.  He is not interested in cardiac rehab since his job is so physical.  Continue aspirin, Effient, Lipitor, losartan and Toprol.  Hypertension Blood pressure is good today, continue losartan and Toprol.  Hyperlipidemia LDL 111.  Continue Lipitor 40 mg daily.  I will recheck fasting lipids today.  Alcohol abuse Patient is drinking occasionally on the weekends.  Tobacco use Patient is still smoking, but trying to wean off.  Disposition: Follow up in 3 month(s) with MD/APP    Signed, Veto Macqueen David Stall, PA-C  05/14/2023 9:13 AM    [facility]

## 2023-05-15 LAB — LIPID PANEL
Chol/HDL Ratio: 2.5 {ratio} (ref 0.0–5.0)
Cholesterol, Total: 139 mg/dL (ref 100–199)
HDL: 55 mg/dL (ref >39)
LDL Chol Calc (NIH): 67 mg/dL (ref 0–99)
Triglycerides: 93 mg/dL (ref 0–149)
VLDL Cholesterol Cal: 17 mg/dL (ref 5–40)

## 2023-09-08 ENCOUNTER — Ambulatory Visit: Payer: BC Managed Care – PPO | Admitting: Medical

## 2023-09-25 ENCOUNTER — Ambulatory Visit: Payer: Self-pay | Admitting: Medical

## 2023-10-08 ENCOUNTER — Ambulatory Visit: Payer: Self-pay | Attending: Medical | Admitting: Medical

## 2023-10-08 ENCOUNTER — Encounter: Payer: Self-pay | Admitting: Medical

## 2023-10-08 VITALS — BP 126/74 | HR 72 | Ht 78.0 in | Wt 220.0 lb

## 2023-10-08 DIAGNOSIS — E782 Mixed hyperlipidemia: Secondary | ICD-10-CM

## 2023-10-08 DIAGNOSIS — I214 Non-ST elevation (NSTEMI) myocardial infarction: Secondary | ICD-10-CM | POA: Diagnosis not present

## 2023-10-08 DIAGNOSIS — F1090 Alcohol use, unspecified, uncomplicated: Secondary | ICD-10-CM

## 2023-10-08 DIAGNOSIS — I10 Essential (primary) hypertension: Secondary | ICD-10-CM

## 2023-10-08 DIAGNOSIS — I2510 Atherosclerotic heart disease of native coronary artery without angina pectoris: Secondary | ICD-10-CM | POA: Diagnosis not present

## 2023-10-08 DIAGNOSIS — Z720 Tobacco use: Secondary | ICD-10-CM

## 2023-10-08 NOTE — Patient Instructions (Addendum)
 Medication Instructions:  Your Physician recommend you continue on your current medication as directed.    *If you need a refill on your cardiac medications before your next appointment, please call your pharmacy*  Follow-Up: At [facility], you and your health needs are our priority.  As part of our continuing mission to provide you with exceptional heart care, our providers are all part of one team.  This team includes your primary Cardiologist (physician) and Advanced Practice Providers or APPs (Physician Assistants and Nurse Practitioners) who all work together to provide you with the care you need, when you need it.  Your next appointment:   4 month(s)  Provider:   You may see Sammy Crisp, MD or one of the following Advanced Practice Providers on your designated Care Team:   Laneta Pintos, NP Gildardo Labrador, PA-C Varney Gentleman, PA-C Cadence Cosby, PA-C Ronald Cockayne, NP Morey Ar, NP    We recommend signing up for the patient portal called "MyChart".  Sign up information is provided on this After Visit Summary.  MyChart is used to connect with patients for Virtual Visits (Telemedicine).  Patients are able to view lab/test results, encounter notes, upcoming appointments, etc.  Non-urgent messages can be sent to your provider as well.   To learn more about what you can do with MyChart, go to [email or web address].   Please call and make an appointment with:   [facility] [address] [place],  [place]  [address]  Main: [phone]  Fax: [phone]

## 2023-10-08 NOTE — Progress Notes (Signed)
 Cardiology Office Note:  .   Date:  10/08/2023  ID:  Prather L Fildes, DOB 09-19-1975, MRN [id number] PCP: Vicente Graham, No  [facility] Providers Cardiologist:  Sammy Crisp, MD     History of Present Illness: .   Christian Bates is a 48 y.o. male  with a hx of alcohol abuse, hypertension, CAD, HLD, tobacco abuse who presents for 3 month follow-up.   The patient was admitted in late September 2024 with non-STEMI.  He presented with chest pain found to have severely elevated blood pressure and elevated troponin to the 500s.  He reported stopping blood pressure medication 6 years prior.  He reported alcohol use of 8-10 beers daily.  Chest CT negative for dissection.  Heart cath showed severe single-vessel CAD with thrombotic occlusion of the proximal ramus intermedius treated with PCI x 1, small distal sidebranch of ramus intermedius remained occluded and too small for intervention.  Otherwise, nonobstructive CAD.  Patient was started on DAPT with aspirin  and prasugrel  for at least 12 months.  Echo showed EF 60 to 65%, mild LVH.  Patient was discharged on aspirin , Effient , Toprol , losartan  and Lipitor.  The patient was last seen 04/2023 and was doing well from a cardiac perspective.   Today, the patient is overall doing well. He denies chest pain or SOB. Says job is very physical. He does not formal activity. He denies bleeding issues. He denies lower leg edema, lightheadedness or dizziness. He is still smoking 1 pack every 2 days. He is drinking alcohol occasionally. He has been trying to eat healthy.    Studies Reviewed: Aaron Aas   EKG Interpretation Date/Time:  Wednesday October 08 2023 08:39:37 EDT Ventricular Rate:  72 PR Interval:  160 QRS Duration:  84 QT Interval:  390 QTC Calculation: 427 R Axis:   52  Text Interpretation: Sinus rhythm with occasional Premature ventricular complexes When compared with ECG of 07-Apr-2023 08:32, Premature ventricular complexes are now Present Nonspecific  T wave abnormality no longer evident in Lateral leads Confirmed by Gennaro Khat, Kenneisha Cochrane ([id number]) on 10/08/2023 8:51:36 AM    Cardiac cath 03/14/23 Conclusions: Severe single-vessel coronary artery disease with thrombotic occlusion of proximal ramus intermedius.  There is otherwise, mild to moderate, nonobstructive coronary artery disease involving the LAD and RCA. Low normal left ventricular systolic function (LVEF 50-55%) with mid anterior hypokinesis. Mildly elevated left ventricular filling pressure (LVEDP 15-20 mmHg). Successful PCI to ramus intermedius using Onyx Frontier 2.5 x 15 mm drug-eluting stent (postdilated to 3.1 mm) with 0% residual stenosis and TIMI-3 flow in the main branch.  Small distal sidebranch of ramus intermedius remained occluded with embolized thrombus, too small for intervention.   Recommendations: Continue tirofiban  infusion for 4 hours. Dual antiplatelet therapy with aspirin  and prasugrel  for at least 12 months. Secondary prevention with coronary artery disease, including high intensity statin therapy, improved blood pressure control, and tobacco cessation. Follow-up echocardiogram.   Sammy Crisp, MD [facility]   Echo 03/15/23 1. Left ventricular ejection fraction, by estimation, is 60 to 65%. The  left ventricle has normal function. The left ventricle has no regional  wall motion abnormalities. There is mild left ventricular hypertrophy of  the basal-septal segment. Left  ventricular diastolic parameters were normal.   2. Right ventricular systolic function is normal. The right ventricular  size is normal. Tricuspid regurgitation signal is inadequate for assessing  PA pressure.   3. The mitral valve is normal in structure. No evidence of mitral valve  regurgitation. No evidence  of mitral stenosis.   4. The aortic valve is normal in structure. Aortic valve regurgitation is  not visualized. No aortic stenosis is present.      Physical Exam:   VS:  BP 126/74    Pulse 72   Ht 6\' 6"  (1.981 m)   Wt 220 lb (99.8 kg)   SpO2 98%   BMI 25.42 kg/m    Wt Readings from Last 3 Encounters:  10/08/23 220 lb (99.8 kg)  05/14/23 219 lb (99.3 kg)  04/07/23 218 lb (98.9 kg)    GEN: Well nourished, well developed in no acute distress NECK: No JVD; No carotid bruits CARDIAC: RRR, no murmurs, rubs, gallops RESPIRATORY:  Clear to auscultation without rales, wheezing or rhonchi  ABDOMEN: Soft, non-tender, non-distended EXTREMITIES:  No edema; No deformity   ASSESSMENT AND PLAN: .    CAD s/p PCE/DES proximal RI 02/2023 The patient denies anginal symptom. He does not formal exercise, but says job is very physical. He has been eating healthier. He denies bleeding issues. Continue ASA, Effient , Lipitor, Losartan  and Toprol .   HTN BP is normal, continue Losartan  50mg  daily and Toprol  25mg  daily.   HLD LDL improved to 67. Continue Lipitor 40mg  daily.   Alcohol abuse He drinks alcohol occasionally.   Tobacco use He is still smoking 1 pack every 2 days. We will give him a list of PCPs to assist with smoking cessation.        Dispo: follow-up in 4 months  Signed, Ty Buntrock Rebekah Canada, PA-C

## 2024-02-11 ENCOUNTER — Ambulatory Visit: Admitting: Internal Medicine

## 2024-03-28 ENCOUNTER — Other Ambulatory Visit: Payer: Self-pay | Admitting: Medical

## 2024-04-08 ENCOUNTER — Encounter: Payer: Self-pay | Admitting: Internal Medicine

## 2024-04-08 ENCOUNTER — Ambulatory Visit: Attending: Internal Medicine | Admitting: Internal Medicine

## 2024-04-08 VITALS — BP 126/78 | HR 69 | Ht 78.0 in | Wt 230.2 lb

## 2024-04-08 DIAGNOSIS — I5A Non-ischemic myocardial injury (non-traumatic): Secondary | ICD-10-CM

## 2024-04-08 DIAGNOSIS — I10 Essential (primary) hypertension: Secondary | ICD-10-CM

## 2024-04-08 DIAGNOSIS — E785 Hyperlipidemia, unspecified: Secondary | ICD-10-CM | POA: Diagnosis not present

## 2024-04-08 DIAGNOSIS — Z79899 Other long term (current) drug therapy: Secondary | ICD-10-CM

## 2024-04-08 DIAGNOSIS — I2510 Atherosclerotic heart disease of native coronary artery without angina pectoris: Secondary | ICD-10-CM | POA: Diagnosis not present

## 2024-04-08 DIAGNOSIS — I214 Non-ST elevation (NSTEMI) myocardial infarction: Secondary | ICD-10-CM

## 2024-04-08 MED ORDER — NITROGLYCERIN 0.4 MG SL SUBL: 0.4000 mg | SUBLINGUAL_TABLET | SUBLINGUAL | 3 refills | Status: AC | PRN

## 2024-04-08 NOTE — Progress Notes (Unsigned)
  Cardiology Office Note:  .   Date:  04/09/2024  ID:  Christian Bates, DOB Nov 17, 1975, MRN [id number] PCP: Pcp, No   [facility] Providers Cardiologist:  Lonni Hanson, MD     History of Present Illness: .   Christian Bates is a 48 y.o. male with history of coronary artery disease status post PCI to ramus intermedius in the setting of NSTEMI and hypertensive emergency in 02/2023, hypertension, hyperlipidemia, and tobacco abuse, who presents for follow-up of coronary artery disease.  He was last seen in our office in 09/2023 by Cadence Furth, PA, at which time he was feeling well.  Today, Christian Bates reports that he has been feeling well, denying chest pain, shortness of breath, palpitations, lightheadedness, and edema.  He notes that he bruises easily and bleeds a little bit more than he is accustomed to if he scratches himself.  He has not had any rectal bleeding or hematuria.  He intermittently checks his blood pressure at home and notes that it is actually a little bit higher at home than it is in the office today.  ROS: See HPI  Studies Reviewed: SABRA   EKG Interpretation Date/Time:  Thursday April 08 2024 08:17:20 EDT Ventricular Rate:  69 PR Interval:  178 QRS Duration:  86 QT Interval:  382 QTC Calculation: 409 R Axis:   40  Text Interpretation: Normal sinus rhythm Possible Left atrial enlargement Borderline ECG When compared with ECG of 08-Oct-2023 08:39, Premature ventricular complexes are no longer Present Confirmed by Pearly Bartosik, Lonni [id number]) on 04/09/2024 6:51:46 AM    Risk Assessment/Calculations:             Physical Exam:   VS:  BP 126/78   Pulse 69   Ht 6' 6 (1.981 m)   Wt 230 lb 3.2 oz (104.4 kg)   SpO2 99%   BMI 26.60 kg/m    Wt Readings from Last 3 Encounters:  04/08/24 230 lb 3.2 oz (104.4 kg)  10/08/23 220 lb (99.8 kg)  05/14/23 219 lb (99.3 kg)    General:  NAD. Neck: No JVD or HJR. Lungs: Clear to auscultation bilaterally without wheezes or  crackles. Heart: Regular rate and rhythm without murmurs, rubs, or gallops. Abdomen: Soft, nontender, nondistended. Extremities: No lower extremity edema.  ASSESSMENT AND PLAN: .    Coronary artery disease: Christian Bates is doing well, one year out from his NSTEMI and PCI to the ramus intermedius.  As he has completed 12 months of DAPT and is concerned about easy bruising/bleeding, we have agreed to discontinue prasugrel  and continue aspirin  81 mg daily.  Continue atorvastatin  40 mg daily as well.  Prescription for sublingual nitroglycerin  sent to his pharmacy, in case he were to have angina in the future.  Check CBC, CMP, and lipid panel today.  Hypertension: Blood pressure well-controlled today.  Continue current doses of losartan  and metoprolol  succinate.  Hyperlipidemia: Check lipid panel and CMP today; anticipate continuing atorvastatin  40 mg daily as long as LDL is at goal.    Dispo: Return to clinic in 1 year.  Signed, Lonni Hanson, MD

## 2024-04-08 NOTE — Patient Instructions (Signed)
 Medication Instructions:  Your physician recommends the following medication changes.  STOP TAKING: Effient   START TAKING: A prescription has been sent in for Nitroglycerin .  If you have chest pain that doesn't relieve quickly, place one tablet under your tongue and allow it to dissolve.  If no relief after 5 minutes, you may take another pill.  If no relief after 5 minutes, you may take a 3rd dose but you need to call 911 and report to ER immediately.  *If you need a refill on your cardiac medications before your next appointment, please call your pharmacy*  Lab Work: Your provider would like for you to have following labs drawn today CBC, CMP, Lipid.     Testing/Procedures: No test ordered today   Follow-Up: At [facility], you and your health needs are our priority.  As part of our continuing mission to provide you with exceptional heart care, our providers are all part of one team.  This team includes your primary Cardiologist (physician) and Advanced Practice Providers or APPs (Physician Assistants and Nurse Practitioners) who all work together to provide you with the care you need, when you need it.  Your next appointment:   1 year(s)  Provider:   You may see Lonni Hanson, MD or one of the following Advanced Practice Providers on your designated Care Team:   Lonni Meager, NP Lesley Maffucci, PA-C Bernardino Bring, PA-C Cadence [place] Breeze, PA-C Tylene Lunch, NP Barnie Hila, NP

## 2024-04-09 ENCOUNTER — Ambulatory Visit: Payer: Self-pay | Admitting: Internal Medicine

## 2024-04-09 ENCOUNTER — Encounter: Payer: Self-pay | Admitting: Internal Medicine

## 2024-04-09 DIAGNOSIS — E785 Hyperlipidemia, unspecified: Secondary | ICD-10-CM | POA: Insufficient documentation

## 2024-04-09 LAB — COMPREHENSIVE METABOLIC PANEL WITH GFR
ALT: 37 IU/L (ref 0–44)
AST: 29 IU/L (ref 0–40)
Albumin: 4.4 g/dL (ref 4.1–5.1)
Alkaline Phosphatase: 110 IU/L (ref 47–123)
BUN/Creatinine Ratio: 12 (ref 9–20)
BUN: 12 mg/dL (ref 6–24)
Bilirubin Total: 0.5 mg/dL (ref 0.0–1.2)
CO2: 23 mmol/L (ref 20–29)
Calcium: 9.6 mg/dL (ref 8.7–10.2)
Chloride: 104 mmol/L (ref 96–106)
Creatinine, Ser: 1.02 mg/dL (ref 0.76–1.27)
Globulin, Total: 2.2 g/dL (ref 1.5–4.5)
Glucose: 100 mg/dL — ABNORMAL HIGH (ref 70–99)
Potassium: 4.8 mmol/L (ref 3.5–5.2)
Sodium: 139 mmol/L (ref 134–144)
Total Protein: 6.6 g/dL (ref 6.0–8.5)
eGFR: 90 mL/min/1.73 (ref >59)

## 2024-04-09 LAB — LIPID PANEL
Chol/HDL Ratio: 2.5 ratio (ref 0.0–5.0)
Cholesterol, Total: 124 mg/dL (ref 100–199)
HDL: 49 mg/dL (ref >39)
LDL Chol Calc (NIH): 61 mg/dL (ref 0–99)
Triglycerides: 68 mg/dL (ref 0–149)
VLDL Cholesterol Cal: 14 mg/dL (ref 5–40)

## 2024-04-09 LAB — CBC
Hematocrit: 44.9 % (ref 37.5–51.0)
Hemoglobin: 15.2 g/dL (ref 13.0–17.7)
MCH: 31.5 pg (ref 26.6–33.0)
MCHC: 33.9 g/dL (ref 31.5–35.7)
MCV: 93 fL (ref 79–97)
Platelets: 172 x10E3/uL (ref 150–450)
RBC: 4.83 x10E6/uL (ref 4.14–5.80)
RDW: 12 % (ref 11.6–15.4)
WBC: 6 x10E3/uL (ref 3.4–10.8)
